# Patient Record
Sex: Male | Born: 1945
Health system: Southern US, Community
[De-identification: ages and names within clinical notes are randomized; demographics above are authoritative.]

## PROBLEM LIST (undated history)

## (undated) DIAGNOSIS — I10 Essential (primary) hypertension: Secondary | ICD-10-CM

## (undated) DIAGNOSIS — R748 Abnormal levels of other serum enzymes: Secondary | ICD-10-CM

## (undated) DIAGNOSIS — I4891 Unspecified atrial fibrillation: Secondary | ICD-10-CM

## (undated) DIAGNOSIS — R39198 Other difficulties with micturition: Secondary | ICD-10-CM

## (undated) DIAGNOSIS — G4730 Sleep apnea, unspecified: Secondary | ICD-10-CM

## (undated) DIAGNOSIS — I499 Cardiac arrhythmia, unspecified: Secondary | ICD-10-CM

## (undated) DIAGNOSIS — N400 Enlarged prostate without lower urinary tract symptoms: Secondary | ICD-10-CM

## (undated) DIAGNOSIS — R972 Elevated prostate specific antigen [PSA]: Secondary | ICD-10-CM

## (undated) DIAGNOSIS — E7800 Pure hypercholesterolemia, unspecified: Secondary | ICD-10-CM

## (undated) HISTORY — DX: Essential (primary) hypertension: I10

## (undated) HISTORY — PX: TONSILLECTOMY: SUR1361

## (undated) HISTORY — PX: PROSTATE BIOPSY: SHX241

## (undated) HISTORY — DX: Abnormal levels of other serum enzymes: R74.8

## (undated) HISTORY — DX: Elevated prostate specific antigen (PSA): R97.20

## (undated) HISTORY — DX: Sleep apnea, unspecified: G47.30

## (undated) HISTORY — DX: Benign prostatic hyperplasia without lower urinary tract symptoms: N40.0

## (undated) HISTORY — DX: Pure hypercholesterolemia, unspecified: E78.00

## (undated) HISTORY — DX: Unspecified atrial fibrillation: I48.91

## (undated) HISTORY — DX: Other difficulties with micturition: R39.198

---

## 2003-03-13 DIAGNOSIS — R972 Elevated prostate specific antigen [PSA]: Secondary | ICD-10-CM | POA: Insufficient documentation

## 2006-03-07 DIAGNOSIS — Z86010 Personal history of colonic polyps: Secondary | ICD-10-CM | POA: Insufficient documentation

## 2006-03-07 DIAGNOSIS — D126 Benign neoplasm of colon, unspecified: Secondary | ICD-10-CM | POA: Insufficient documentation

## 2008-01-03 HISTORY — PX: HERNIA REPAIR: SHX51

## 2012-01-30 DIAGNOSIS — J385 Laryngeal spasm: Secondary | ICD-10-CM | POA: Insufficient documentation

## 2012-05-02 DIAGNOSIS — I482 Chronic atrial fibrillation, unspecified: Secondary | ICD-10-CM | POA: Insufficient documentation

## 2014-01-02 DIAGNOSIS — Z9889 Other specified postprocedural states: Secondary | ICD-10-CM

## 2014-01-02 HISTORY — DX: Other specified postprocedural states: Z98.890

## 2015-04-20 DIAGNOSIS — N401 Enlarged prostate with lower urinary tract symptoms: Secondary | ICD-10-CM | POA: Insufficient documentation

## 2016-07-03 HISTORY — PX: COLONOSCOPY: SHX174

## 2017-05-29 DIAGNOSIS — G4733 Obstructive sleep apnea (adult) (pediatric): Secondary | ICD-10-CM | POA: Insufficient documentation

## 2017-06-04 LAB — BASIC METABOLIC PANEL
BUN: 27 — AB (ref 4–21)
CO2: 31 — AB (ref 13–22)
Chloride: 101 (ref 99–108)
Creatinine: 1 (ref 0.6–1.3)
Glucose: 72
Potassium: 4.3 (ref 3.4–5.3)
Sodium: 141 (ref 137–147)

## 2017-06-04 LAB — CBC AND DIFFERENTIAL
HCT: 44 (ref 41–53)
Hemoglobin: 14.1 (ref 13.5–17.5)
Platelets: 219 (ref 150–399)
WBC: 8.8

## 2017-06-04 LAB — CBC: RBC: 4.29 (ref 3.87–5.11)

## 2017-06-04 LAB — COMPREHENSIVE METABOLIC PANEL: Calcium: 9.7 (ref 8.7–10.7)

## 2017-06-14 LAB — CBC AND DIFFERENTIAL
Hemoglobin: 14.3 (ref 13.5–17.5)
Neutrophils Absolute: 5
Platelets: 232 (ref 150–399)
WBC: 7.2

## 2017-06-14 LAB — VITAMIN B12: Vitamin B-12: 793

## 2017-06-14 LAB — CBC: RBC: 4.34 (ref 3.87–5.11)

## 2017-11-23 DIAGNOSIS — R030 Elevated blood-pressure reading, without diagnosis of hypertension: Secondary | ICD-10-CM | POA: Insufficient documentation

## 2018-06-04 DIAGNOSIS — M545 Low back pain, unspecified: Secondary | ICD-10-CM | POA: Insufficient documentation

## 2019-06-05 LAB — BASIC METABOLIC PANEL
BUN: 16 (ref 4–21)
CO2: 29 — AB (ref 13–22)
Chloride: 101 (ref 99–108)
Creatinine: 0.8 (ref 0.6–1.3)
Glucose: 80
Potassium: 4.4 (ref 3.4–5.3)
Sodium: 139 (ref 137–147)

## 2019-06-05 LAB — CBC: RBC: 4.22 (ref 3.87–5.11)

## 2019-06-05 LAB — CBC AND DIFFERENTIAL
HCT: 45 (ref 41–53)
Hemoglobin: 14.5 (ref 13.5–17.5)
Platelets: 177 (ref 150–399)
WBC: 9.2

## 2019-06-05 LAB — COMPREHENSIVE METABOLIC PANEL
Calcium: 9.7 (ref 8.7–10.7)
GFR calc non Af Amer: 87

## 2019-06-20 DIAGNOSIS — K4091 Unilateral inguinal hernia, without obstruction or gangrene, recurrent: Secondary | ICD-10-CM | POA: Insufficient documentation

## 2019-06-20 LAB — BASIC METABOLIC PANEL
BUN: 19 (ref 4–21)
CO2: 27 — AB (ref 13–22)
Chloride: 104 (ref 99–108)
Creatinine: 0.8 (ref 0.6–1.3)
Glucose: 82
Potassium: 4.6 (ref 3.4–5.3)
Sodium: 139 (ref 137–147)

## 2019-06-20 LAB — COMPREHENSIVE METABOLIC PANEL
Calcium: 9.8 (ref 8.7–10.7)
GFR calc non Af Amer: 88

## 2019-06-21 LAB — CBC: RBC: 4.33 (ref 3.87–5.11)

## 2019-06-21 LAB — CBC AND DIFFERENTIAL
HCT: 45 (ref 41–53)
Hemoglobin: 15.2 (ref 13.5–17.5)
Platelets: 204 (ref 150–399)
WBC: 7.1

## 2019-07-24 ENCOUNTER — Other Ambulatory Visit: Payer: Self-pay

## 2019-07-24 ENCOUNTER — Encounter: Payer: Self-pay | Admitting: Nurse Practitioner

## 2019-07-24 ENCOUNTER — Ambulatory Visit: Payer: Medicare PPO | Admitting: Nurse Practitioner

## 2019-07-24 VITALS — BP 110/80 | HR 67 | Temp 97.8°F | Ht 68.0 in | Wt 184.0 lb

## 2019-07-24 DIAGNOSIS — M47812 Spondylosis without myelopathy or radiculopathy, cervical region: Secondary | ICD-10-CM

## 2019-07-24 DIAGNOSIS — K4090 Unilateral inguinal hernia, without obstruction or gangrene, not specified as recurrent: Secondary | ICD-10-CM

## 2019-07-24 DIAGNOSIS — E782 Mixed hyperlipidemia: Secondary | ICD-10-CM

## 2019-07-24 DIAGNOSIS — R350 Frequency of micturition: Secondary | ICD-10-CM

## 2019-07-24 DIAGNOSIS — N401 Enlarged prostate with lower urinary tract symptoms: Secondary | ICD-10-CM

## 2019-07-24 DIAGNOSIS — I480 Paroxysmal atrial fibrillation: Secondary | ICD-10-CM | POA: Diagnosis not present

## 2019-07-24 DIAGNOSIS — H9113 Presbycusis, bilateral: Secondary | ICD-10-CM | POA: Diagnosis not present

## 2019-07-24 DIAGNOSIS — Z7189 Other specified counseling: Secondary | ICD-10-CM | POA: Diagnosis not present

## 2019-07-24 DIAGNOSIS — I10 Essential (primary) hypertension: Secondary | ICD-10-CM

## 2019-07-24 DIAGNOSIS — G4733 Obstructive sleep apnea (adult) (pediatric): Secondary | ICD-10-CM

## 2019-07-24 DIAGNOSIS — K5904 Chronic idiopathic constipation: Secondary | ICD-10-CM

## 2019-07-24 DIAGNOSIS — Z9989 Dependence on other enabling machines and devices: Secondary | ICD-10-CM

## 2019-07-24 NOTE — Progress Notes (Signed)
Careteam: Patient Care Team: Lauree Chandler, NP as PCP - General (Geriatric Medicine)  Advanced Directive information    No Known Allergies  Chief Complaint  Patient presents with   Establish Care    New patient to establish care.     HPI: Patient is a 74 y.o. male seen in today at the [facility] to establish care. Previously with [facility] and has been at [facility] for 2 years.   Last AWV 06/20/2019  Reports he has a hernia and taking OTC stool softener for constipation and metamucil.   OA of the neck- will occasional get a stiff neck and uses skelaxin 800 mg every 8 hours as needed if it happens. Will use heat. Has not needed this year  A fib- rate controlled, on Toprol XL and xarelto 20 mg daily for anticoagulation. Does not see cardiology  BPH with frequency- on tamsulosin 0.4 mg daily, gets up 2-3 times at night. A little worse recently   Sleep apnea- has CPAP, gets supplies through [facility]    Review of Systems:  Review of Systems  Constitutional: Negative for chills, fever and weight loss.  HENT: Positive for hearing loss (hearing loss). Negative for tinnitus.   Respiratory: Negative for cough, sputum production and shortness of breath.   Cardiovascular: Negative for chest pain, palpitations and leg swelling.  Gastrointestinal: Negative for abdominal pain, constipation, diarrhea and heartburn.  Genitourinary: Negative for dysuria, frequency and urgency.  Musculoskeletal: Negative for back pain, falls, joint pain and myalgias.  Skin: Negative.   Neurological: Negative for dizziness and headaches.  Psychiatric/Behavioral: Negative for depression and memory loss. The patient does not have insomnia.    Past Medical History:  Diagnosis Date   Atrial fibrillation [facility])    Per [place] new patient packet   Hx of colonoscopy 2016   Per [place] new patient packet   Sleep apnea    Per [place] new patient packet   Past Surgical History:  Procedure Laterality Date    HERNIA REPAIR  2010   Per [place] new patient packet   Social History:   reports that he has never smoked. He has never used smokeless tobacco. He reports current alcohol use of about 14.0 standard drinks of alcohol per week. He reports that he does not use drugs.  Family History  Problem Relation Age of Onset   Aneurysm Father    Heart attack Father     Medications: Patient's Medications  New Prescriptions   No medications on file  Previous Medications   DOCUSATE CALCIUM (STOOL SOFTENER PO)    Take by mouth daily. As needed   METAXALONE (SKELAXIN) 800 MG TABLET    Take 800 mg by mouth 3 (three) times daily as needed. For pain   METOPROLOL SUCCINATE (TOPROL-XL) 25 MG 24 HR TABLET    Take 25 mg by mouth daily.   PRAVASTATIN (PRAVACHOL) 20 MG TABLET    Take 20 mg by mouth daily.   PSYLLIUM FIBER PO    Take by mouth daily. As needed   RIVAROXABAN (XARELTO) 20 MG TABS TABLET    Take 20 mg by mouth daily.   TAMSULOSIN (FLOMAX) 0.4 MG CAPS CAPSULE    Take 4 mg by mouth daily.  Modified Medications   No medications on file  Discontinued Medications   No medications on file    Physical Exam:  Vitals:   07/24/19 1002  BP: 110/80  Pulse: 67  Temp: 97.8 F (36.6 C)  TempSrc:  Temporal  SpO2: 99%  Weight: 184 lb (83.5 kg)  Height: 5\' 8"  (1.727 m)   Body mass index is 27.98 kg/m. Wt Readings from Last 3 Encounters:  07/24/19 184 lb (83.5 kg)    Physical Exam Constitutional:      General: He is not in acute distress.    Appearance: He is well-developed. He is not diaphoretic.  HENT:     Head: Normocephalic and atraumatic.     Mouth/Throat:     Pharynx: No oropharyngeal exudate.  Eyes:     Conjunctiva/sclera: Conjunctivae normal.     Pupils: Pupils are equal, round, and reactive to light.  Cardiovascular:     Rate and Rhythm: Normal rate and regular rhythm.     Heart sounds: Normal heart sounds.  Pulmonary:     Effort: Pulmonary effort is normal.     Breath sounds:  Normal breath sounds.  Abdominal:     General: Bowel sounds are normal.     Palpations: Abdomen is soft.     Hernia: A hernia is present. Right inguinal: easily reducible, nontender.  Musculoskeletal:        General: No tenderness.     Cervical back: Normal range of motion and neck supple.  Skin:    General: Skin is warm and dry.  Neurological:     Mental Status: He is alert and oriented to person, place, and time.  Psychiatric:        Mood and Affect: Mood normal.        Behavior: Behavior normal.     Labs reviewed: Basic Metabolic Panel: No results for input(s): NA, K, CL, CO2, GLUCOSE, BUN, CREATININE, CALCIUM, MG, PHOS, TSH in the last 8760 hours. Liver Function Tests: No results for input(s): AST, ALT, ALKPHOS, BILITOT, PROT, ALBUMIN in the last 8760 hours. No results for input(s): LIPASE, AMYLASE in the last 8760 hours. No results for input(s): AMMONIA in the last 8760 hours. CBC: No results for input(s): WBC, NEUTROABS, HGB, HCT, MCV, PLT in the last 8760 hours. Lipid Panel: No results for input(s): CHOL, HDL, LDLCALC, TRIG, CHOLHDL, LDLDIRECT in the last 8760 hours. TSH: No results for input(s): TSH in the last 8760 hours. A1C: No results found for: HGBA1C   Assessment/Plan 1. Advance care planning -to bring in documents and discussed completing MOST form - DNR (Do Not Resuscitate)  2. Benign prostatic hyperplasia with urinary frequency -ongoing, currently taking flomax 0.4 mg daily, gets up 2-3 times at night, hx of elevated PSA, will follow up PSA with next labs.   3. Paroxysmal atrial fibrillation (HCC) Rate controlled. Continues on metoprolol twice daily for rate and xarelto for anticoagulation. No signs of bleeding or abnormal bruising noted. Will follow up CBC prior to next appt.   4. Presbycusis of both ears -stable with bilateral hearing aids.   5. Inguinal hernia of right side without obstruction or gangrene Has improved with control of constipation.  Easily reducible and nontender. Education provided on when to seek emergent medical attention.   6. Chronic idiopathic constipation Stable on colace and metamucil   7. Spondylosis of cervical region without myelopathy or radiculopathy Stable at this time. Occasionally will get increase in pain and uses skelaxin PRN, rarely needs  8. OSA on CPAP Stable, uses CPAP every night.  9. Essential hypertension Controlled on metoprolol and dietary modifications.  10. Mixed hyperlipidemia Will follow up lipids prior to next appt. Continues on pravastatin and dietary modifications.   Next appt: 5 months with labs  prior  Next AWV due in June 2022 Meeyah Ovitt K. [place], [facility] [phone]

## 2019-11-18 ENCOUNTER — Encounter: Payer: Self-pay | Admitting: Nurse Practitioner

## 2019-11-20 NOTE — Telephone Encounter (Signed)
Message routed to Lauree Chandler, NP . Please Advise.

## 2019-11-25 ENCOUNTER — Encounter: Payer: Self-pay | Admitting: Nurse Practitioner

## 2019-12-01 ENCOUNTER — Encounter: Payer: Self-pay | Admitting: Nurse Practitioner

## 2019-12-01 NOTE — Telephone Encounter (Signed)
Message routed to Eubanks, Jessica K, NP  

## 2019-12-16 ENCOUNTER — Other Ambulatory Visit: Payer: Self-pay

## 2019-12-18 LAB — BASIC METABOLIC PANEL
BUN: 22 — AB (ref 4–21)
CO2: 28 — AB (ref 13–22)
Chloride: 102 (ref 99–108)
Creatinine: 0.9 (ref 0.6–1.3)
Glucose: 83
Potassium: 4.4 (ref 3.4–5.3)
Sodium: 139 (ref 137–147)

## 2019-12-18 LAB — CBC AND DIFFERENTIAL
HCT: 42 (ref 41–53)
Hemoglobin: 14.5 (ref 13.5–17.5)
Platelets: 195 (ref 150–399)
WBC: 7.2

## 2019-12-18 LAB — CBC: RBC: 4.17 (ref 3.87–5.11)

## 2019-12-18 LAB — HEPATIC FUNCTION PANEL
ALT: 29 (ref 10–40)
AST: 27 (ref 14–40)
Alkaline Phosphatase: 58 (ref 25–125)
Bilirubin, Total: 1.7

## 2019-12-18 LAB — COMPREHENSIVE METABOLIC PANEL
Albumin: 4.3 (ref 3.5–5.0)
Calcium: 9.5 (ref 8.7–10.7)

## 2019-12-18 LAB — PSA: PSA: 7.23

## 2019-12-19 ENCOUNTER — Encounter: Payer: Self-pay | Admitting: *Deleted

## 2019-12-23 ENCOUNTER — Other Ambulatory Visit: Payer: Self-pay

## 2019-12-23 ENCOUNTER — Encounter: Payer: Self-pay | Admitting: Nurse Practitioner

## 2019-12-23 ENCOUNTER — Ambulatory Visit: Payer: Medicare PPO | Admitting: Nurse Practitioner

## 2019-12-23 VITALS — BP 130/80 | HR 54 | Temp 97.5°F | Ht 68.0 in | Wt 188.5 lb

## 2019-12-23 DIAGNOSIS — N401 Enlarged prostate with lower urinary tract symptoms: Secondary | ICD-10-CM

## 2019-12-23 DIAGNOSIS — I10 Essential (primary) hypertension: Secondary | ICD-10-CM

## 2019-12-23 DIAGNOSIS — R350 Frequency of micturition: Secondary | ICD-10-CM

## 2019-12-23 DIAGNOSIS — Z86010 Personal history of colonic polyps: Secondary | ICD-10-CM

## 2019-12-23 DIAGNOSIS — H9113 Presbycusis, bilateral: Secondary | ICD-10-CM

## 2019-12-23 DIAGNOSIS — R972 Elevated prostate specific antigen [PSA]: Secondary | ICD-10-CM

## 2019-12-23 DIAGNOSIS — E782 Mixed hyperlipidemia: Secondary | ICD-10-CM

## 2019-12-23 DIAGNOSIS — Z9989 Dependence on other enabling machines and devices: Secondary | ICD-10-CM

## 2019-12-23 DIAGNOSIS — G4733 Obstructive sleep apnea (adult) (pediatric): Secondary | ICD-10-CM

## 2019-12-23 DIAGNOSIS — I480 Paroxysmal atrial fibrillation: Secondary | ICD-10-CM

## 2019-12-23 NOTE — Progress Notes (Signed)
Careteam: Patient Care Team: Lauree Chandler, NP as PCP - General (Geriatric Medicine)  Advanced Directive information Does Patient Have a Medical Advance Directive?: Yes, Type of Advance Directive: Out of facility DNR (pink MOST or yellow form), Pre-existing out of facility DNR order (yellow form or pink MOST form): Yellow form placed in chart (order not valid for inpatient use), Does patient want to make changes to medical advance directive?: No - Patient declined  No Known Allergies  Chief Complaint  Patient presents with   Medical Management of Chronic Issues    5 month follow up.     HPI: Patient is a 74 y.o. male seen in today at the [facility] for routine follow up.  OSA- continues on CPAP    A fib- ongoing occasional palpitations, overall stable. No chest pains. No LE edema.   Last colonoscopy 07/2016. 10 mm adenomatous polyp found. Recommended repeat in 3 years (2021- did not get done)   Pt with hx of elevated PSA- went to urologist for years then he was told he did not need to be seen anymore.  Reports he did have biopsy of prostate ~10 years ago and it was negative. Continues with urinary frequency. Will have multiple trips to the bathroom. Takes a while to urinate.  His urologist tried for many years to get him to do something for his prostate but he declined.  Review of Systems:  Review of Systems  Constitutional: Negative for chills, fever and weight loss.  HENT: Negative for tinnitus.   Respiratory: Negative for cough, sputum production and shortness of breath.   Cardiovascular: Negative for chest pain, palpitations and leg swelling.  Gastrointestinal: Positive for constipation (controlled). Negative for abdominal pain, diarrhea and heartburn.  Genitourinary: Positive for frequency. Negative for dysuria and urgency.  Musculoskeletal: Negative for back pain, falls, joint pain and myalgias.  Skin: Negative.   Neurological: Negative for dizziness  and headaches.  Psychiatric/Behavioral: Negative for depression and memory loss. The patient does not have insomnia.     Past Medical History:  Diagnosis Date   Atrial fibrillation [facility])    Per [place] new patient packet   BPH (benign prostatic hyperplasia)    Elevated PSA    Enlarged prostate    High cholesterol    Hx of colonoscopy 2016   Per [place] new patient packet   Hypertension    Sleep apnea    Per [place] new patient packet   Slow urinary stream    Past Surgical History:  Procedure Laterality Date   COLONOSCOPY  07/03/2016   HERNIA REPAIR Right 2010   inguinal hernia    PROSTATE BIOPSY     TONSILLECTOMY     Social History:   reports that he has never smoked. He has never used smokeless tobacco. He reports current alcohol use of about 14.0 standard drinks of alcohol per week. He reports that he does not use drugs.  Family History  Problem Relation Age of Onset   Aneurysm Father    Heart attack Father    Heart disease Father     Medications: Patient's Medications  New Prescriptions   No medications on file  Previous Medications   DOCUSATE CALCIUM (STOOL SOFTENER PO)    Take by mouth daily. As needed   METAXALONE (SKELAXIN) 800 MG TABLET    Take 800 mg by mouth 3 (three) times daily as needed. For pain   METOPROLOL SUCCINATE (TOPROL-XL) 25 MG 24 HR TABLET    Take  25 mg by mouth daily.   PRAVASTATIN (PRAVACHOL) 20 MG TABLET    Take 20 mg by mouth daily.   PSYLLIUM FIBER PO    Take by mouth daily. As needed   RIVAROXABAN (XARELTO) 20 MG TABS TABLET    Take 20 mg by mouth daily.   TAMSULOSIN (FLOMAX) 0.4 MG CAPS CAPSULE    Take 4 mg by mouth daily.  Modified Medications   No medications on file  Discontinued Medications   No medications on file    Physical Exam:  Vitals:   12/23/19 0923  BP: 130/80  Pulse: (!) 54  Temp: (!) 97.5 F (36.4 C)  TempSrc: Temporal  SpO2: 99%  Weight: 188 lb 8 oz (85.5 kg)  Height: 5\' 8"  (1.727 m)   Body mass  index is 28.66 kg/m. Wt Readings from Last 3 Encounters:  12/23/19 188 lb 8 oz (85.5 kg)  07/24/19 184 lb (83.5 kg)    Physical Exam Constitutional:      General: He is not in acute distress.    Appearance: He is well-developed and well-nourished. He is not diaphoretic.  HENT:     Head: Normocephalic and atraumatic.     Mouth/Throat:     Mouth: Oropharynx is clear and moist.     Pharynx: No oropharyngeal exudate.  Eyes:     Extraocular Movements: EOM normal.     Conjunctiva/sclera: Conjunctivae normal.     Pupils: Pupils are equal, round, and reactive to light.  Cardiovascular:     Rate and Rhythm: Normal rate and regular rhythm.     Heart sounds: Normal heart sounds.  Pulmonary:     Effort: Pulmonary effort is normal.     Breath sounds: Normal breath sounds.  Abdominal:     General: Bowel sounds are normal.     Palpations: Abdomen is soft.  Musculoskeletal:        General: No tenderness or edema.     Cervical back: Normal range of motion and neck supple.     Right lower leg: No edema.     Left lower leg: No edema.  Skin:    General: Skin is warm and dry.  Neurological:     Mental Status: He is alert and oriented to person, place, and time.  Psychiatric:        Mood and Affect: Mood and affect normal.     Labs reviewed: Basic Metabolic Panel: Recent Labs    06/05/19 0000 06/20/19 0000 12/18/19 0000  NA 139 139 139  K 4.4 4.6 4.4  CL 101 104 102  CO2 29* 27* 28*  BUN 16 19 22*  CREATININE 0.8 0.8 0.9  CALCIUM 9.7 9.8 9.5   Liver Function Tests: Recent Labs    12/18/19 0000  AST 27  ALT 29  ALKPHOS 58  ALBUMIN 4.3   No results for input(s): LIPASE, AMYLASE in the last 8760 hours. No results for input(s): AMMONIA in the last 8760 hours. CBC: Recent Labs    06/05/19 0000 06/21/19 0000 12/18/19 0000  WBC 9.2 7.1 7.2  HGB 14.5 15.2 14.5  HCT 45 45 42  PLT 177 204 195   Lipid Panel: No results for input(s): CHOL, HDL, LDLCALC, TRIG, CHOLHDL,  LDLDIRECT in the last 8760 hours. TSH: No results for input(s): TSH in the last 8760 hours. A1C: No results found for: HGBA1C   Assessment/Plan 1. Benign prostatic hyperplasia with urinary frequency -ongoing and stable, continues on flomax  2. Elevated PSA -slow rise  in PSA, now at 7.23 up from 6 3 years ago. Previously followed by urology but he was not interested in any intervention at that time. Biopsy was negative in the past.   3. History of adenomatous polyp of colon GI recommended follow up in 2021, will get referral at this time.  - Ambulatory referral to Gastroenterology  4. Paroxysmal atrial fibrillation (HCC) Rate controlled. contines on xarelto for anticoaguation and metoprolol for rate control. No signs of bleeding or abnormal bruising at this time.   5. OSA on CPAP -stable on cpap  6. Essential hypertension Well controlled on toprol-XL  7. Mixed hyperlipidemia -continues on pravastatin with dietary modifications.   8. Presbycusis of both ears -continues with hearing aids Next appt: 6 months, labs prior to appt.   Carlos American. [place], [facility] [phone]

## 2019-12-23 NOTE — Patient Instructions (Signed)
Let us know if you decide you want urology referral in regards to prostate.  Follow up in 6 months, labs before appt.

## 2020-04-29 ENCOUNTER — Other Ambulatory Visit: Payer: Self-pay

## 2020-04-29 ENCOUNTER — Ambulatory Visit: Payer: Medicare PPO | Admitting: Nurse Practitioner

## 2020-04-29 VITALS — HR 71

## 2020-04-29 DIAGNOSIS — R310 Gross hematuria: Secondary | ICD-10-CM

## 2020-04-29 DIAGNOSIS — R350 Frequency of micturition: Secondary | ICD-10-CM | POA: Diagnosis not present

## 2020-04-29 DIAGNOSIS — N401 Enlarged prostate with lower urinary tract symptoms: Secondary | ICD-10-CM

## 2020-04-29 NOTE — Progress Notes (Signed)
Careteam: Patient Care Team: Lauree Chandler, NP as PCP - General (Geriatric Medicine)  Advanced Directive information    No Known Allergies  No chief complaint on file.    HPI: Patient is a 75 y.o. male seen in today at the [facility] for blood in urine.  He noticed blood in the urine 4 nights ago. Started pink got darker then resolved by morning.Also had some leakage.    He has had this happen before June 2017 and December 2018 at the time he had a cscope which was normal- no findings. No episodes since then.  Would like referral to urologist due to this  Also having frequency . Taking flomax   Reports he was fount to have UTI.   Review of Systems:  Review of Systems  Constitutional: Negative for chills and fever.  HENT: Negative for tinnitus.   Cardiovascular: Negative for chest pain and palpitations.  Gastrointestinal: Negative for abdominal pain, constipation, diarrhea and heartburn.  Genitourinary: Positive for frequency, hematuria and urgency. Negative for dysuria.  Musculoskeletal: Negative for back pain, falls, joint pain and myalgias.  Skin: Negative.   Neurological: Negative for dizziness and headaches.    Past Medical History:  Diagnosis Date  . Atrial fibrillation ([place])    Per [facility] new patient packet  . BPH (benign prostatic hyperplasia)   . Elevated PSA   . Enlarged prostate   . High cholesterol   . Hx of colonoscopy 2016   Per [place] new patient packet  . Hypertension   . Sleep apnea    Per [place] new patient packet  . Slow urinary stream    Past Surgical History:  Procedure Laterality Date  . COLONOSCOPY  07/03/2016  . HERNIA REPAIR Right 2010   inguinal hernia   . PROSTATE BIOPSY    . TONSILLECTOMY     Social History:   reports that he has never smoked. He has never used smokeless tobacco. He reports current alcohol use of about 14.0 standard drinks of alcohol per week. He reports that he does not use drugs.  Family History   Problem Relation Age of Onset  . Aneurysm Father   . Heart attack Father   . Heart disease Father     Medications: Patient's Medications  New Prescriptions   No medications on file  Previous Medications   DOCUSATE CALCIUM (STOOL SOFTENER PO)    Take by mouth daily. As needed   METAXALONE (SKELAXIN) 800 MG TABLET    Take 800 mg by mouth 3 (three) times daily as needed. For pain   METOPROLOL SUCCINATE (TOPROL-XL) 25 MG 24 HR TABLET    Take 25 mg by mouth daily.   PRAVASTATIN (PRAVACHOL) 20 MG TABLET    Take 20 mg by mouth daily.   PSYLLIUM FIBER PO    Take by mouth daily. As needed   RIVAROXABAN (XARELTO) 20 MG TABS TABLET    Take 20 mg by mouth daily.   TAMSULOSIN (FLOMAX) 0.4 MG CAPS CAPSULE    Take 4 mg by mouth daily.  Modified Medications   No medications on file  Discontinued Medications   No medications on file    Physical Exam:  There were no vitals filed for this visit. There is no height or weight on file to calculate BMI. Wt Readings from Last 3 Encounters:  12/23/19 188 lb 8 oz (85.5 kg)  07/24/19 184 lb (83.5 kg)    Physical Exam Constitutional:      General:  He is not in acute distress.    Appearance: He is well-developed. He is not diaphoretic.  HENT:     Head: Normocephalic and atraumatic.     Mouth/Throat:     Pharynx: No oropharyngeal exudate.  Eyes:     Conjunctiva/sclera: Conjunctivae normal.     Pupils: Pupils are equal, round, and reactive to light.  Cardiovascular:     Rate and Rhythm: Normal rate and regular rhythm.     Heart sounds: Normal heart sounds.  Pulmonary:     Effort: Pulmonary effort is normal.     Breath sounds: Normal breath sounds.  Abdominal:     General: Bowel sounds are normal.     Palpations: Abdomen is soft.  Musculoskeletal:        General: No tenderness.     Cervical back: Normal range of motion and neck supple.  Skin:    General: Skin is warm and dry.  Neurological:     Mental Status: He is alert and oriented to  person, place, and time.    Labs reviewed: Basic Metabolic Panel: Recent Labs    06/05/19 0000 06/20/19 0000 12/18/19 0000  NA 139 139 139  K 4.4 4.6 4.4  CL 101 104 102  CO2 29* 27* 28*  BUN 16 19 22*  CREATININE 0.8 0.8 0.9  CALCIUM 9.7 9.8 9.5   Liver Function Tests: Recent Labs    12/18/19 0000  AST 27  ALT 29  ALKPHOS 58  ALBUMIN 4.3   No results for input(s): LIPASE, AMYLASE in the last 8760 hours. No results for input(s): AMMONIA in the last 8760 hours. CBC: Recent Labs    06/05/19 0000 06/21/19 0000 12/18/19 0000  WBC 9.2 7.1 7.2  HGB 14.5 15.2 14.5  HCT 45 45 42  PLT 177 204 195   Lipid Panel: No results for input(s): CHOL, HDL, LDLCALC, TRIG, CHOLHDL, LDLDIRECT in the last 8760 hours. TSH: No results for input(s): TSH in the last 8760 hours. A1C: No results found for: HGBA1C   Assessment/Plan 1. Gross hematuria Resolved at this time but continues to have frequency and urgency with some leakage. Will send urine off for UA C&S -to remain hydrated -no fevers or chills at this time but if occurs to seek medical attention immediately - Ambulatory referral to Urology  2. Benign prostatic hyperplasia with urinary frequency Increase frequency, UA C&S  - Ambulatory referral to Urology  Next appt: 06/22/2020 as scheduled, sooner if needed Randy [place], [facility] [phone]

## 2020-04-30 ENCOUNTER — Telehealth: Payer: Self-pay

## 2020-04-30 NOTE — Telephone Encounter (Addendum)
Discussed results with patient and he verbalized his understanding. He has received call from urology and scheduled appointment with them for May 9th.    ----- Message from Lauree Chandler, NP sent at 04/30/2020  2:45 PM EDT ----- NO urinary tract infection noted on labs, urine was normal, urology referral has been placed  ----- Message ----- From: May, Anita A, CMA Sent: 04/30/2020   2:26 PM EDT To: Lauree Chandler, NP

## 2020-05-10 ENCOUNTER — Encounter: Payer: Self-pay | Admitting: Urology

## 2020-05-10 ENCOUNTER — Ambulatory Visit: Payer: Medicare PPO | Admitting: Urology

## 2020-05-10 ENCOUNTER — Other Ambulatory Visit: Payer: Self-pay

## 2020-05-10 VITALS — BP 163/82 | HR 67 | Ht 67.0 in | Wt 186.0 lb

## 2020-05-10 DIAGNOSIS — N390 Urinary tract infection, site not specified: Secondary | ICD-10-CM | POA: Diagnosis not present

## 2020-05-10 DIAGNOSIS — N400 Enlarged prostate without lower urinary tract symptoms: Secondary | ICD-10-CM | POA: Diagnosis not present

## 2020-05-10 DIAGNOSIS — R319 Hematuria, unspecified: Secondary | ICD-10-CM | POA: Diagnosis not present

## 2020-05-10 LAB — BLADDER SCAN AMB NON-IMAGING

## 2020-05-10 NOTE — Progress Notes (Signed)
05/10/20 1:29 PM   Simonne Martinet [birth year] [id number]  CC: Gross hematuria, elevated PSA, BPH  HPI: I saw Mr. Prevette for the above issues.  He is a relatively healthy 75 year old male who has a long history of BPH and urinary symptoms on Flomax.  His primary urinary symptoms are weak stream, feeling of incomplete emptying, urinary frequency, nocturia every 2 hours overnight.  He is also had multiple episodes of gross hematuria in the past that have been evaluated at [facility] with CT urogram and cystoscopy.  He reports some light pink urine over the last 1 to 2 weeks that is new.  He denies any blood clots in the urine.  Most recent imaging was a CT urogram in January 2019 that showed no hydronephrosis or nephrolithiasis, thickened bladder with multiple diverticula, and prostate measured 97 g.  Cystoscopy at that time showed BPH with obstructive lateral lobes, but no suspicious bladder lesions.  Renal function is normal with creatinine 0.82.  Urinalysis today pending.   PMH: Past Medical History:  Diagnosis Date  . Atrial fibrillation ([place])    Per [facility] new patient packet  . BPH (benign prostatic hyperplasia)   . Elevated PSA   . Enlarged prostate   . High cholesterol   . Hx of colonoscopy 2016   Per [place] new patient packet  . Hypertension   . Sleep apnea    Per [place] new patient packet  . Slow urinary stream     Surgical History: Past Surgical History:  Procedure Laterality Date  . COLONOSCOPY  07/03/2016  . HERNIA REPAIR Right 2010   inguinal hernia   . PROSTATE BIOPSY    . TONSILLECTOMY      Family History: Family History  Problem Relation Age of Onset  . Aneurysm Father   . Heart attack Father   . Heart disease Father     Social History:  reports that he has never smoked. He has never used smokeless tobacco. He reports current alcohol use of about 14.0 standard drinks of alcohol per week. He reports that he does not use drugs.  Physical Exam: BP (!) 163/82 (BP  Location: Left Arm, Patient Position: Sitting, Cuff Size: Normal)   Pulse 67   Ht 5\' 7"  (1.702 m)   Wt 186 lb (84.4 kg)   BMI 29.13 kg/m    Constitutional:  Alert and oriented, No acute distress. Cardiovascular: No clubbing, cyanosis, or edema. Respiratory: Normal respiratory effort, no increased work of breathing. GI: Abdomen is soft, nontender, nondistended, no abdominal masses  Laboratory Data: Reviewed, see HPI  Pertinent Imaging: I have personally viewed and interpreted the CT urogram from care everywhere dated 01/14/2017 showing no hydronephrosis or nephrolithiasis, and prostate measuring 97 g and multiple bladder diverticula  Assessment & Plan:   75 year old male with atrial fibrillation on Eliquis who presents with recurrent gross hematuria.  This has been going on at least 5 to 6 years now intermittently, and is likely secondary to BPH with enlarged prostate in the setting of anticoagulation.  He has had 2 negative gross hematuria work-up with [facility], most recently in 2019, and prostate measures 97 g.  He is also had elevated PSA of 7 with a history of negative biopsy, and that has been stable over the last 5+ years, likely elevated secondary to enlarged prostate BPH.  We discussed the risks and benefits of HoLEP at length.  The procedure requires general anesthesia and takes 2 to 3 hours, and a holmium laser is used to  enucleate the prostate and push this tissue into the bladder.  A morcellator is then used to remove this tissue, which is sent for pathology.  The vast majority of patients are able to discharge the same day with a catheter in place for 2 to 3 days, and will follow-up in clinic for a voiding trial.  Approximately 5% of patients will be admitted overnight to monitor the urine, or if they have multiple co-morbidities.  We specifically discussed the risks of bleeding, infection, retrograde ejaculation, temporary urgency and urge incontinence, very low risk of long-term  incontinence, pathologic evaluation of prostate tissue and possible detection of prostate cancer or other malignancy, and possible need for additional procedures.  -Follow-up UA results, call if UTI -He will consider HOLEP, I do think he would benefit significantly from this with his history of recurrent gross hematuria, UTI, and large prostate with bladder diverticula  Nickolas Madrid, MD 05/10/2020  [facility] [address] [place], [place] [address] [phone]

## 2020-05-10 NOTE — Patient Instructions (Signed)

## 2020-05-11 LAB — MICROSCOPIC EXAMINATION
Bacteria, UA: NONE SEEN
Epithelial Cells (non renal): NONE SEEN /hpf (ref 0–10)

## 2020-05-11 LAB — URINALYSIS, COMPLETE
Bilirubin, UA: NEGATIVE
Glucose, UA: NEGATIVE
Ketones, UA: NEGATIVE
Leukocytes,UA: NEGATIVE
Nitrite, UA: NEGATIVE
Protein,UA: NEGATIVE
RBC, UA: NEGATIVE
Specific Gravity, UA: 1.015 (ref 1.005–1.030)
Urobilinogen, Ur: 0.2 mg/dL (ref 0.2–1.0)
pH, UA: 6.5 (ref 5.0–7.5)

## 2020-05-12 ENCOUNTER — Telehealth: Payer: Self-pay

## 2020-05-12 NOTE — Telephone Encounter (Signed)
-----   Message from Billey Co, MD sent at 05/12/2020  8:46 AM EDT ----- No blood or infection on urinalysis.  He should call if he would like to proceed with scheduling HOLEP for his BPH and recurrent bleeding.  Otherwise, schedule 1 year follow-up  Nickolas Madrid, MD 05/12/2020

## 2020-06-14 ENCOUNTER — Encounter: Payer: Self-pay | Admitting: Nurse Practitioner

## 2020-06-17 LAB — COMPREHENSIVE METABOLIC PANEL
Albumin: 4.2 (ref 3.5–5.0)
Calcium: 9.4 (ref 8.7–10.7)
GFR calc Af Amer: 83
GFR calc non Af Amer: 71
Globulin: 2.2

## 2020-06-17 LAB — HEPATIC FUNCTION PANEL
ALT: 26 (ref 10–40)
AST: 26 (ref 14–40)
Alkaline Phosphatase: 50 (ref 25–125)
Bilirubin, Total: 2.3

## 2020-06-17 LAB — BASIC METABOLIC PANEL
BUN: 18 (ref 4–21)
CO2: 29 — AB (ref 13–22)
Chloride: 104 (ref 99–108)
Creatinine: 1 (ref 0.6–1.3)
Glucose: 90
Potassium: 4.2 (ref 3.4–5.3)
Sodium: 138 (ref 137–147)

## 2020-06-17 LAB — LIPID PANEL
Cholesterol: 149 (ref 0–200)
HDL: 73 — AB (ref 35–70)
LDL Cholesterol: 63
Triglycerides: 53 (ref 40–160)

## 2020-06-17 LAB — CBC AND DIFFERENTIAL
HCT: 41 (ref 41–53)
Hemoglobin: 14.1 (ref 13.5–17.5)
Neutrophils Absolute: 4249
Platelets: 169 (ref 150–399)
WBC: 7.3

## 2020-06-17 LAB — CBC: RBC: 4.17 (ref 3.87–5.11)

## 2020-06-21 ENCOUNTER — Other Ambulatory Visit: Payer: Self-pay | Admitting: Nurse Practitioner

## 2020-06-22 ENCOUNTER — Encounter: Payer: Self-pay | Admitting: Nurse Practitioner

## 2020-06-22 ENCOUNTER — Ambulatory Visit: Payer: Medicare PPO | Admitting: Nurse Practitioner

## 2020-06-22 ENCOUNTER — Other Ambulatory Visit: Payer: Self-pay

## 2020-06-22 VITALS — BP 110/80 | HR 82 | Temp 98.3°F | Ht 67.0 in | Wt 193.0 lb

## 2020-06-22 DIAGNOSIS — Z9989 Dependence on other enabling machines and devices: Secondary | ICD-10-CM

## 2020-06-22 DIAGNOSIS — K5904 Chronic idiopathic constipation: Secondary | ICD-10-CM

## 2020-06-22 DIAGNOSIS — Z86010 Personal history of colonic polyps: Secondary | ICD-10-CM | POA: Diagnosis not present

## 2020-06-22 DIAGNOSIS — G4733 Obstructive sleep apnea (adult) (pediatric): Secondary | ICD-10-CM

## 2020-06-22 DIAGNOSIS — R350 Frequency of micturition: Secondary | ICD-10-CM

## 2020-06-22 DIAGNOSIS — N401 Enlarged prostate with lower urinary tract symptoms: Secondary | ICD-10-CM | POA: Diagnosis not present

## 2020-06-22 DIAGNOSIS — E782 Mixed hyperlipidemia: Secondary | ICD-10-CM

## 2020-06-22 DIAGNOSIS — I480 Paroxysmal atrial fibrillation: Secondary | ICD-10-CM

## 2020-06-22 NOTE — Progress Notes (Signed)
Careteam: Patient Care Team: Lauree Chandler, NP as PCP - General (Geriatric Medicine)  Advanced Directive information Does Patient Have a Medical Advance Directive?: Yes, Type of Advance Directive: Out of facility DNR (pink MOST or yellow form), Pre-existing out of facility DNR order (yellow form or pink MOST form): Yellow form placed in chart (order not valid for inpatient use), Does patient want to make changes to medical advance directive?: No - Patient declined  No Known Allergies  Chief Complaint  Patient presents with   Medical Management of Chronic Issues    6 month follow up. Discuss lab results.     HPI: Patient is a 75 y.o. male seen in today at the [facility] for routine follow up.   Followed with urologist due to blood in urine. Discussed procedures for BPH but he is not sure if he wants to go ahead with these. Continues on flomax for frequency.   It was recommended to have follow up colonoscopy in 3 years. He was referred to GI but has not gotten this done due to his wife's health.   OSA-on cpap  Hyperlipidemia- controlled on pravachol  A fib- xarelto for anticoagulation- no signs of bleeding and metoprolol for rate control.   He is drinking 3 vodka drinks in the evening.   Review of Systems:  Review of Systems  Constitutional:  Negative for chills, fever and weight loss.  HENT:  Negative for tinnitus.   Respiratory:  Negative for cough, sputum production and shortness of breath.   Cardiovascular:  Negative for chest pain, palpitations and leg swelling.  Gastrointestinal:  Positive for constipation (controlled with softener and fiber). Negative for abdominal pain, diarrhea and heartburn.  Genitourinary:  Positive for frequency. Negative for dysuria and urgency.  Musculoskeletal:  Negative for back pain, falls, joint pain and myalgias.  Skin: Negative.   Neurological:  Negative for dizziness and headaches.  Psychiatric/Behavioral:  Negative for  depression and memory loss. The patient does not have insomnia.    Past Medical History:  Diagnosis Date   Atrial fibrillation [facility])    Per [place] new patient packet   BPH (benign prostatic hyperplasia)    Elevated PSA    Enlarged prostate    High cholesterol    Hx of colonoscopy 2016   Per [place] new patient packet   Hypertension    Sleep apnea    Per [place] new patient packet   Slow urinary stream    Past Surgical History:  Procedure Laterality Date   COLONOSCOPY  07/03/2016   HERNIA REPAIR Right 2010   inguinal hernia    PROSTATE BIOPSY     TONSILLECTOMY     Social History:   reports that he has never smoked. He has never used smokeless tobacco. He reports current alcohol use of about 14.0 standard drinks of alcohol per week. He reports that he does not use drugs.  Family History  Problem Relation Age of Onset   Aneurysm Father    Heart attack Father    Heart disease Father     Medications: Patient's Medications  New Prescriptions   No medications on file  Previous Medications   DOCUSATE CALCIUM (STOOL SOFTENER PO)    Take by mouth daily. As needed   METAXALONE (SKELAXIN) 800 MG TABLET    Take 800 mg by mouth 3 (three) times daily as needed. For pain   METOPROLOL SUCCINATE (TOPROL-XL) 25 MG 24 HR TABLET    TAKE ONE TABLET BY MOUTH  EVERY DAY   PRAVASTATIN (PRAVACHOL) 20 MG TABLET    Take 20 mg by mouth daily.   PSYLLIUM FIBER PO    Take by mouth daily. As needed   RIVAROXABAN (XARELTO) 20 MG TABS TABLET    Take 20 mg by mouth daily.   TAMSULOSIN (FLOMAX) 0.4 MG CAPS CAPSULE    TAKE 1 CAPSULE BY MOUTH EVERY DAY  Modified Medications   No medications on file  Discontinued Medications   No medications on file    Physical Exam:  Vitals:   06/22/20 1004  BP: 110/80  Pulse: 82  Temp: 98.3 F (36.8 C)  TempSrc: Oral  SpO2: 98%  Weight: 193 lb (87.5 kg)  Height: 5\' 7"  (1.702 m)   Body mass index is 30.23 kg/m. Wt Readings from Last 3 Encounters:  06/22/20 193 lb  (87.5 kg)  05/10/20 186 lb (84.4 kg)  12/23/19 188 lb 8 oz (85.5 kg)    Physical Exam Constitutional:      General: He is not in acute distress.    Appearance: He is well-developed. He is not diaphoretic.  HENT:     Head: Normocephalic and atraumatic.     Right Ear: External ear normal.     Left Ear: External ear normal.     Mouth/Throat:     Pharynx: No oropharyngeal exudate.  Eyes:     Conjunctiva/sclera: Conjunctivae normal.     Pupils: Pupils are equal, round, and reactive to light.  Cardiovascular:     Rate and Rhythm: Normal rate and regular rhythm.     Heart sounds: Normal heart sounds.  Pulmonary:     Effort: Pulmonary effort is normal.     Breath sounds: Normal breath sounds.  Abdominal:     General: Bowel sounds are normal.     Palpations: Abdomen is soft.  Musculoskeletal:        General: No tenderness.     Cervical back: Normal range of motion and neck supple.     Right lower leg: No edema.     Left lower leg: No edema.  Skin:    General: Skin is warm and dry.  Neurological:     Mental Status: He is alert and oriented to person, place, and time.    Labs reviewed: Basic Metabolic Panel: Recent Labs    12/18/19 0000  NA 139  K 4.4  CL 102  CO2 28*  BUN 22*  CREATININE 0.9  CALCIUM 9.5   Liver Function Tests: Recent Labs    12/18/19 0000  AST 27  ALT 29  ALKPHOS 58  ALBUMIN 4.3   No results for input(s): LIPASE, AMYLASE in the last 8760 hours. No results for input(s): AMMONIA in the last 8760 hours. CBC: Recent Labs    12/18/19 0000  WBC 7.2  HGB 14.5  HCT 42  PLT 195   Lipid Panel: No results for input(s): CHOL, HDL, LDLCALC, TRIG, CHOLHDL, LDLDIRECT in the last 8760 hours. TSH: No results for input(s): TSH in the last 8760 hours. A1C: No results found for: HGBA1C   Assessment/Plan 1. Benign prostatic hyperplasia with urinary frequency -stable. Followed by urology and on flomax 0.4 mg daily  2. History of adenomatous polyp  of colon -plans to schedule colonoscopy  3. Paroxysmal atrial fibrillation (HCC) -rate controlled, continues on xarelto 20 mg daily for anticoagulation and rate controlled on metoprolol   4. OSA on CPAP Stable, reports compliance.   5. Mixed hyperlipidemia Lipids at goal. Continue on statin.  6. Chronic idiopathic constipation Controlled with OTC  Next appt: 6 months, labs prior [place] Nydam K. Fouke, [facility] [phone]

## 2020-06-29 ENCOUNTER — Other Ambulatory Visit: Payer: Self-pay | Admitting: Nurse Practitioner

## 2020-09-09 ENCOUNTER — Telehealth: Payer: Self-pay

## 2020-09-09 ENCOUNTER — Encounter: Payer: Self-pay | Admitting: Nurse Practitioner

## 2020-09-09 ENCOUNTER — Ambulatory Visit (INDEPENDENT_AMBULATORY_CARE_PROVIDER_SITE_OTHER): Payer: Medicare PPO | Admitting: Nurse Practitioner

## 2020-09-09 ENCOUNTER — Other Ambulatory Visit: Payer: Self-pay

## 2020-09-09 DIAGNOSIS — Z0000 Encounter for general adult medical examination without abnormal findings: Secondary | ICD-10-CM | POA: Diagnosis not present

## 2020-09-09 NOTE — Progress Notes (Signed)
Subjective:   Randy Montgomery is a 75 y.o. male who presents for Medicare Annual/Subsequent preventive examination.  Review of Systems     Cardiac Risk Factors include: obesity (BMI >30kg/m2);hypertension;dyslipidemia;advanced age (>62mn, >>64women)     Objective:    There were no vitals filed for this visit. There is no height or weight on file to calculate BMI.  Advanced Directives 09/09/2020 06/22/2020 12/23/2019  Does Patient Have a Medical Advance Directive? Yes Yes Yes  Type of Advance Directive Out of facility DNR (pink MOST or yellow form) Out of facility DNR (pink MOST or yellow form) Out of facility DNR (pink MOST or yellow form)  Does patient want to make changes to medical advance directive? No - Patient declined No - Patient declined No - Patient declined  Pre-existing out of facility DNR order (yellow form or pink MOST form) Yellow form placed in chart (order not valid for inpatient use) Yellow form placed in chart (order not valid for inpatient use) Yellow form placed in chart (order not valid for inpatient use)    Current Medications (verified) Outpatient Encounter Medications as of 09/09/2020  Medication Sig   Docusate Calcium (STOOL SOFTENER PO) Take by mouth daily. As needed   metaxalone (SKELAXIN) 800 MG tablet Take 800 mg by mouth 3 (three) times daily as needed. For pain   metoprolol succinate (TOPROL-XL) 25 MG 24 hr tablet TAKE ONE TABLET BY MOUTH EVERY DAY   pravastatin (PRAVACHOL) 20 MG tablet TAKE ONE TABLET BY MOUTH EVERY DAY   PSYLLIUM FIBER PO Take by mouth daily. As needed   tamsulosin (FLOMAX) 0.4 MG CAPS capsule TAKE 1 CAPSULE BY MOUTH EVERY DAY   XARELTO 20 MG TABS tablet TAKE ONE TABLET BY MOUTH EVERY DAY WITH EVENING MEAL   No facility-administered encounter medications on file as of 09/09/2020.    Allergies (verified) Patient has no known allergies.   History: Past Medical History:  Diagnosis Date   [place]    [facility] [place]   BPH (benign prostatic hyperplasia)    Elevated PSA    Enlarged prostate    High cholesterol    Hx of colonoscopy 2016   Per [facility] new patient packet   Hypertension    [place] apnea    [place] [place]   Slow urinary stream    Past Surgical History:  Procedure Laterality Date   COLONOSCOPY  07/03/2016   HERNIA REPAIR Right 2010   inguinal hernia    PROSTATE BIOPSY     TONSILLECTOMY     Family History  Problem Relation Age of Onset   Aneurysm Father    Heart attack Father    Heart disease Father    Social History   Socioeconomic History   Marital status: Married    Spouse name: Not on file   Number of children: Not on file   Years of education: Not on file   Highest education level: Not on file  Occupational History   Not on file  Tobacco Use   Smoking status: Never   Smokeless tobacco: Never  Vaping Use   Vaping Use: Never used  Substance and Sexual Activity   Alcohol use: Yes    Alcohol/week: 14.0 standard drinks    Types: 14 Standard drinks or equivalent per week    Comment: burbon 3 drinks a night    Drug use: Never   Sexual activity: Not on file  Other Topics Concern   Not  on file  Social History Narrative   Diet      Do you drink/eat things with caffeine Yes      Marital Status Married  What year were you married? 1968      Do you live in a house, apartment, assisted living, condo, trailer, etc.? Assisted living      Is it one or more stories? 1      How many persons live in your home? 2         Do you have any pets in your home?(please list): No      Highest level of education completed: BA, [employer] 1967      Current or past profession: Civil engineer, contracting      Do you exercise?: Moderately  Type and how often: Wak, use wellnes equipment 2-3 times a week      Living Will?      DNR form? Yes    If not, do you wish to discuss one?       POA/HPOA forms? Yes      Difficulty bathing or dressing yourself? Patient did not answer  these questions on new patient packet      Difficulty preparing food or eating?      Difficulty managing medications?      Difficulty managing your finances?      Difficulty affording your medications?                     Social Determinants of Health   Financial Resource Strain: Not on file  Food Insecurity: Not on file  Transportation Needs: Not on file  Physical Activity: Not on file  Stress: Not on file  Social Connections: Not on file    Tobacco Counseling Counseling given: Not Answered   Clinical Intake:  Pre-visit preparation completed: Yes  Pain : No/denies pain     BMI - recorded: 30 Nutritional Status: BMI > 30  Obese Nutritional Risks: None  How often do you need to have someone help you when you read instructions, pamphlets, or other written materials from your doctor or pharmacy?: 1 - Never  Diabetic?no         Activities of Daily Living In your present state of health, do you have any difficulty performing the following activities: 09/09/2020  Hearing? Y  Vision? N  Difficulty concentrating or making decisions? N  Walking or climbing stairs? N  Dressing or bathing? N  Doing errands, shopping? N  Preparing Food and eating ? N  Using the Toilet? N  In the past six months, have you accidently leaked urine? N  Do you have problems with loss of bowel control? N  Managing your Medications? N  Managing your Finances? N  Some recent data might be hidden    Patient Care Team: Lauree Chandler, NP as PCP - General (Geriatric Medicine)  Indicate any recent Medical Services you may have received from other than [facility] providers in the past year (date may be approximate).     Assessment:   This is a routine wellness examination for Randy Montgomery.  Hearing/Vision screen Hearing Screening - Comments:: Patient wears hearing aids. Vision Screening - Comments:: Patient wears glasses. Patient had eye exam within past year. Patient sees Dr. Everardo Pacific  Dietary issues and exercise activities discussed: Current Exercise Habits: Home exercise routine, Type of exercise: calisthenics;walking, Time (Minutes): 25, Frequency (Times/Week): 3, Weekly Exercise (Minutes/Week): 75   Goals Addressed   None    Depression  Screen PHQ 2/9 Scores 09/09/2020 07/24/2019  PHQ - 2 Score 0 0    Fall Risk Fall Risk  09/09/2020 07/24/2019  Falls in the past year? 0 0  Number falls in past yr: 0 0  Injury with Fall? 0 0  Risk for fall due to : No Fall Risks -  Follow up Falls evaluation completed -    FALL RISK PREVENTION PERTAINING TO THE HOME:  Any stairs in or around the home? Yes  If so, are there any without handrails? No  Home free of loose throw rugs in walkways, pet beds, electrical cords, etc? Yes  Adequate lighting in your home to reduce risk of falls? Yes   ASSISTIVE DEVICES UTILIZED TO PREVENT FALLS:  Life alert? No  Use of a cane, walker or w/c? No  Grab bars in the bathroom? Yes  Shower chair or bench in shower? Yes  Elevated toilet seat or a handicapped toilet? Yes   TIMED UP AND GO:  Was the test performed? No .    Cognitive Function:     6CIT Screen 09/09/2020  What Year? 0 points  What month? 0 points  What time? 0 points  Count back from 20 0 points  Months in reverse 2 points  Repeat phrase 0 points  Total Score 2    Immunizations Immunization History  Administered Date(s) Administered   Influenza Split 10/24/2011   Influenza, High Dose Seasonal PF 10/01/2014, 09/22/2015   Influenza, Seasonal, Injecte, Preservative Fre 12/06/1999, 09/26/2012   Influenza,inj,Quad PF,6+ Mos 09/23/2013, 10/01/2014, 10/04/2016, 10/23/2017, 10/24/2017   Influenza-Unspecified 09/23/2013, 10/21/2019   Moderna Sars-Covid-2 Vaccination 01/16/2019, 02/13/2019, 11/18/2019, 05/20/2020   Pneumococcal Conjugate-13 11/07/2012   Pneumococcal Polysaccharide-23 06/16/2008, 06/25/2009   Td 04/15/1992, 04/05/2002   Tdap 12/16/2010   Tetanus  04/05/2002   Zoster Recombinat (Shingrix) 09/17/2017, 12/19/2017   Zoster, Live 06/10/2007    TDAP status: Up to date  Flu Vaccine status: Due, Education has been provided regarding the importance of this vaccine. Advised may receive this vaccine at local pharmacy or Health Dept. Aware to provide a copy of the vaccination record if obtained from local pharmacy or Health Dept. Verbalized acceptance and understanding.  Pneumococcal vaccine status: Up to date  Covid-19 vaccine status: Information provided on how to obtain vaccines.   Qualifies for Shingles Vaccine? Yes   Zostavax completed Yes   Shingrix Completed?: Yes  Screening Tests Health Maintenance  Topic Date Due   INFLUENZA VACCINE  08/02/2020   TETANUS/TDAP  12/15/2020   COVID-19 Vaccine  Completed   Hepatitis C Screening  Completed   PNA vac Low Risk Adult  Completed   Zoster Vaccines- Shingrix  Completed   HPV VACCINES  Aged Out    Health Maintenance  Health Maintenance Due  Topic Date Due   INFLUENZA VACCINE  08/02/2020    Colorectal cancer screening: No longer required.   Lung Cancer Screening: (Low Dose CT Chest recommended if Age 61-80 years, 30 pack-year currently smoking OR have quit w/in 15years.) does not qualify.   Lung Cancer Screening Referral: na  Additional Screening:  Hepatitis C Screening: does qualify; Completed 2016  Vision Screening: Recommended annual ophthalmology exams for early detection of glaucoma and other disorders of the eye. Is the patient up to date with their annual eye exam?  Yes  Who is the provider or what is the name of the office in which the patient attends annual eye exams? [facility] If pt is not established with a provider, would they  like to be referred to a provider to establish care? No .   Dental Screening: Recommended annual dental exams for proper oral hygiene  Community Resource Referral / Chronic Care Management: CRR required this visit?  No   CCM required this  visit?  No      Plan:     I have personally reviewed and noted the following in the patient's chart:   Medical and social history Use of alcohol, tobacco or illicit drugs  Current medications and supplements including opioid prescriptions. Patient is not currently taking opioid prescriptions. Functional ability and status Nutritional status Physical activity Advanced directives List of other physicians Hospitalizations, surgeries, and ER visits in previous 12 months Vitals Screenings to include cognitive, depression, and falls Referrals and appointments  In addition, I have reviewed and discussed with patient certain preventive protocols, quality metrics, and best practice recommendations. A written personalized care plan for preventive services as well as general preventive health recommendations were provided to patient.     Lauree Chandler, NP   09/09/2020    Virtual Visit via Telephone Note  I connected withNAME@ on 09/09/20 at 10:00 AM EDT by telephone and verified that I am speaking with the correct person using two identifiers.  Location: Patient: home Provider: [facility]   I discussed the limitations, risks, security and privacy concerns of performing an evaluation and management service by telephone and the availability of in person appointments. I also discussed with the patient that there may be a patient responsible charge related to this service. The patient expressed understanding and agreed to proceed.   I discussed the assessment and treatment plan with the patient. The patient was provided an opportunity to ask questions and all were answered. The patient agreed with the plan and demonstrated an understanding of the instructions.   The patient was advised to call back or seek an in-person evaluation if the symptoms worsen or if the condition fails to improve as anticipated.  I provided 18 minutes of non-face-to-face time during this encounter.  Carlos American. Harle Battiest Avs printed and mailed

## 2020-09-09 NOTE — Progress Notes (Signed)
This service is provided via telemedicine  No vital signs collected/recorded due to the encounter was a telemedicine visit.   Location of patient (ex: home, work):  Home  Patient consents to a telephone visit:  Yes, see encounter dated 09/09/2020  Location of the provider (ex: office, home):  [place]  Name of any referring provider:  N/A  Names of all persons participating in the telemedicine service and their role in the encounter:  Sherrie Mustache, Nurse Practitioner, [name], CMA, and patient.   Time spent on call:  13 minutes with medical assistant

## 2020-09-09 NOTE — Telephone Encounter (Signed)
[name], rajaram are scheduled for a virtual visit with your provider today.    Just as we do with appointments in the office, we must obtain your consent to participate.  Your consent will be active for this visit and any virtual visit you may have with one of our providers in the next 365 days.    If you have a MyChart account, I can also send a copy of this consent to you electronically.  All virtual visits are billed to your insurance company just like a traditional visit in the office.  As this is a virtual visit, video technology does not allow for your provider to perform a traditional examination.  This may limit your provider's ability to fully assess your condition.  If your provider identifies any concerns that need to be evaluated in person or the need to arrange testing such as labs, EKG, etc, we will make arrangements to do so.    Although advances in technology are sophisticated, we cannot ensure that it will always work on either your end or our end.  If the connection with a video visit is poor, we may have to switch to a telephone visit.  With either a video or telephone visit, we are not always able to ensure that we have a secure connection.   I need to obtain your verbal consent now.   Are you willing to proceed with your visit today?   Randy Montgomery has provided verbal consent on 09/09/2020 for a virtual visit (video or telephone).   [name], CMA 09/09/2020  10:02 AM

## 2020-09-09 NOTE — Patient Instructions (Signed)
Randy Montgomery , Thank you for taking time to come for your Medicare Wellness Visit. I appreciate your ongoing commitment to your health goals. Please review the following plan we discussed and let me know if I can assist you in the future.   Screening recommendations/referrals: Colonoscopy up to date Recommended yearly ophthalmology/optometry visit for glaucoma screening and checkup Recommended yearly dental visit for hygiene and checkup  Vaccinations: Influenza vaccine RECOMMENDED at this time   Pneumococcal vaccine up to date Tdap vaccine up to date Shingles vaccine up to date COVID vaccine- recommended     Advanced directives: recommended to bring to place on file.   Conditions/risks identified: advanced age  Next appointment: yearly for AWV   Preventive Care 93 Years and Older, Male Preventive care refers to lifestyle choices and visits with your health care provider that can promote health and wellness. What does preventive care include? A yearly physical exam. This is also called an annual well check. Dental exams once or twice a year. Routine eye exams. Ask your health care provider how often you should have your eyes checked. Personal lifestyle choices, including: Daily care of your teeth and gums. Regular physical activity. Eating a healthy diet. Avoiding tobacco and drug use. Limiting alcohol use. Practicing safe sex. Taking low doses of aspirin every day. Taking vitamin and mineral supplements as recommended by your health care provider. What happens during an annual well check? The services and screenings done by your health care provider during your annual well check will depend on your age, overall health, lifestyle risk factors, and family history of disease. Counseling  Your health care provider may ask you questions about your: Alcohol use. Tobacco use. Drug use. Emotional well-being. Home and relationship well-being. Sexual activity. Eating  habits. History of falls. Memory and ability to understand (cognition). Work and work Statistician. Screening  You may have the following tests or measurements: Height, weight, and BMI. Blood pressure. Lipid and cholesterol levels. These may be checked every 5 years, or more frequently if you are over 15 years old. Skin check. Lung cancer screening. You may have this screening every year starting at age 108 if you have a 30-pack-year history of smoking and currently smoke or have quit within the past 15 years. Fecal occult blood test (FOBT) of the stool. You may have this test every year starting at age 48. Flexible sigmoidoscopy or colonoscopy. You may have a sigmoidoscopy every 5 years or a colonoscopy every 10 years starting at age 72. Prostate cancer screening. Recommendations will vary depending on your family history and other risks. Hepatitis C blood test. Hepatitis B blood test. Sexually transmitted disease (STD) testing. Diabetes screening. This is done by checking your blood sugar (glucose) after you have not eaten for a while (fasting). You may have this done every 1-3 years. Abdominal aortic aneurysm (AAA) screening. You may need this if you are a current or former smoker. Osteoporosis. You may be screened starting at age 17 if you are at high risk. Talk with your health care provider about your test results, treatment options, and if necessary, the need for more tests. Vaccines  Your health care provider may recommend certain vaccines, such as: Influenza vaccine. This is recommended every year. Tetanus, diphtheria, and acellular pertussis (Tdap, Td) vaccine. You may need a Td booster every 10 years. Zoster vaccine. You may need this after age 25. Pneumococcal 13-valent conjugate (PCV13) vaccine. One dose is recommended after age 3. Pneumococcal polysaccharide (PPSV23) vaccine. One dose is  recommended after age 70. Talk to your health care provider about which screenings and  vaccines you need and how often you need them. This information is not intended to replace advice given to you by your health care provider. Make sure you discuss any questions you have with your health care provider. Document Released: 01/15/2015 Document Revised: 09/08/2015 Document Reviewed: 10/20/2014 Elsevier Interactive Patient Education  2017 [place] Prevention in the Home Falls can cause injuries. They can happen to people of all ages. There are many things you can do to make your home safe and to help prevent falls. What can I do on the outside of my home? Regularly fix the edges of walkways and driveways and fix any cracks. Remove anything that might make you trip as you walk through a door, such as a raised step or threshold. Trim any bushes or trees on the path to your home. Use bright outdoor lighting. Clear any walking paths of anything that might make someone trip, such as rocks or tools. Regularly check to see if handrails are loose or broken. Make sure that both sides of any steps have handrails. Any raised decks and porches should have guardrails on the edges. Have any leaves, snow, or ice cleared regularly. Use sand or salt on walking paths during winter. Clean up any spills in your garage right away. This includes oil or grease spills. What can I do in the bathroom? Use night lights. Install grab bars by the toilet and in the tub and shower. Do not use towel bars as grab bars. Use non-skid mats or decals in the tub or shower. If you need to sit down in the shower, use a plastic, non-slip stool. Keep the floor dry. Clean up any water that spills on the floor as soon as it happens. Remove soap buildup in the tub or shower regularly. Attach bath mats securely with double-sided non-slip rug tape. Do not have throw rugs and other things on the floor that can make you trip. What can I do in the bedroom? Use night lights. Make sure that you have a light by your  bed that is easy to reach. Do not use any sheets or blankets that are too big for your bed. They should not hang down onto the floor. Have a firm chair that has side arms. You can use this for support while you get dressed. Do not have throw rugs and other things on the floor that can make you trip. What can I do in the kitchen? Clean up any spills right away. Avoid walking on wet floors. Keep items that you use a lot in easy-to-reach places. If you need to reach something above you, use a strong step stool that has a grab bar. Keep electrical cords out of the way. Do not use floor polish or wax that makes floors slippery. If you must use wax, use non-skid floor wax. Do not have throw rugs and other things on the floor that can make you trip. What can I do with my stairs? Do not leave any items on the stairs. Make sure that there are handrails on both sides of the stairs and use them. Fix handrails that are broken or loose. Make sure that handrails are as long as the stairways. Check any carpeting to make sure that it is firmly attached to the stairs. Fix any carpet that is loose or worn. Avoid having throw rugs at the top or bottom of the stairs. If  you do have throw rugs, attach them to the floor with carpet tape. Make sure that you have a light switch at the top of the stairs and the bottom of the stairs. If you do not have them, ask someone to add them for you. What else can I do to help prevent falls? Wear shoes that: Do not have high heels. Have rubber bottoms. Are comfortable and fit you well. Are closed at the toe. Do not wear sandals. If you use a stepladder: Make sure that it is fully opened. Do not climb a closed stepladder. Make sure that both sides of the stepladder are locked into place. Ask someone to hold it for you, if possible. Clearly mark and make sure that you can see: Any grab bars or handrails. First and last steps. Where the edge of each step is. Use tools that  help you move around (mobility aids) if they are needed. These include: Canes. Walkers. Scooters. Crutches. Turn on the lights when you go into a dark area. Replace any light bulbs as soon as they burn out. Set up your furniture so you have a clear path. Avoid moving your furniture around. If any of your floors are uneven, fix them. If there are any pets around you, be aware of where they are. Review your medicines with your doctor. Some medicines can make you feel dizzy. This can increase your chance of falling. Ask your doctor what other things that you can do to help prevent falls. This information is not intended to replace advice given to you by your health care provider. Make sure you discuss any questions you have with your health care provider. Document Released: 10/15/2008 Document Revised: 05/27/2015 Document Reviewed: 01/23/2014 Elsevier Interactive Patient Education  2017 [employer].

## 2020-09-20 ENCOUNTER — Other Ambulatory Visit: Payer: Self-pay | Admitting: Nurse Practitioner

## 2020-09-21 ENCOUNTER — Telehealth: Payer: Self-pay

## 2020-09-21 NOTE — Telephone Encounter (Signed)
Pt. Ready to schedule colonoscopy 

## 2020-09-22 ENCOUNTER — Telehealth: Payer: Self-pay | Admitting: *Deleted

## 2020-09-22 ENCOUNTER — Other Ambulatory Visit (INDEPENDENT_AMBULATORY_CARE_PROVIDER_SITE_OTHER): Payer: Self-pay

## 2020-09-22 ENCOUNTER — Other Ambulatory Visit: Payer: Self-pay

## 2020-09-22 DIAGNOSIS — Z86010 Personal history of colonic polyps: Secondary | ICD-10-CM

## 2020-09-22 MED ORDER — PEG 3350-KCL-NA BICARB-NACL 420 G PO SOLR: 4000.0000 mL | Freq: Once | ORAL | 0 refills | Status: AC

## 2020-09-22 NOTE — Telephone Encounter (Signed)
Received fax from [facility] with [facility] [phone] [phone] regarding Blood Thinner Information Request.  Patient has been scheduled for a Colonoscopy procedure on 10/06/2020.  Patient is currently taking Xarelto 20mg  and they need to know if he needs to [facility] and when to restart it back or if he needs to continue taking.   Form placed in [place] folder to review and sign.  To be faxed back to [facility] with [facility] once completed.

## 2020-09-22 NOTE — Progress Notes (Signed)
Gastroenterology Pre-Procedure Review  Request Date: 1005/2022 Requesting Physician: Dr. Vicente Males  PATIENT REVIEW QUESTIONS: The patient responded to the following health history questions as indicated:    1. Are you having any GI issues? no 2. Do you have a personal history of Polyps? yes (removed some last colonoscopy) 3. Do you have a family history of Colon Cancer or Polyps? no 4. Diabetes Mellitus? no 5. Joint replacements in the past 12 months?no 6. Major health problems in the past 3 months?yes (hernia) 7. Any artificial heart valves, MVP, or defibrillator?no but has afib    MEDICATIONS & ALLERGIES:    Patient reports the following regarding taking any anticoagulation/antiplatelet therapy:   Plavix, Coumadin, Eliquis, Xarelto, Lovenox, Pradaxa, Brilinta, or Effient? yes (xarelto) Aspirin? no  Patient confirms/reports the following medications:  Current Outpatient Medications  Medication Sig Dispense Refill   Docusate Calcium (STOOL SOFTENER PO) Take by mouth daily. As needed     metaxalone (SKELAXIN) 800 MG tablet Take 800 mg by mouth 3 (three) times daily as needed. For pain     metoprolol succinate (TOPROL-XL) 25 MG 24 hr tablet TAKE ONE TABLET BY MOUTH EVERY DAY 90 tablet 1   pravastatin (PRAVACHOL) 20 MG tablet TAKE ONE TABLET BY MOUTH EVERY DAY 90 tablet 1   PSYLLIUM FIBER PO Take by mouth daily. As needed     tamsulosin (FLOMAX) 0.4 MG CAPS capsule TAKE 1 CAPSULE BY MOUTH EVERY DAY 90 capsule 1   XARELTO 20 MG TABS tablet TAKE ONE TABLET BY MOUTH EVERY DAY WITH EVENING MEAL 90 tablet 1   No current facility-administered medications for this visit.    Patient confirms/reports the following allergies:  No Known Allergies  No orders of the defined types were placed in this encounter.   AUTHORIZATION INFORMATION Primary Insurance: 1D#: Group #:  Secondary Insurance: 1D#: Group #:  SCHEDULE INFORMATION: Date: 10/06/2020 Time: Location: [facility]

## 2020-09-22 NOTE — Progress Notes (Signed)
Sending blood thinner clearance

## 2020-09-23 ENCOUNTER — Telehealth: Payer: Self-pay

## 2020-09-23 NOTE — Telephone Encounter (Signed)
Called patient about his blood thinner clearance he understands to stop 48 hours prior and can restart after procedure if no problems

## 2020-10-06 ENCOUNTER — Ambulatory Visit
Admission: RE | Admit: 2020-10-06 | Discharge: 2020-10-06 | Disposition: A | Discharge status: Home / Self Care | Payer: Medicare PPO | Attending: Gastroenterology | Admitting: Gastroenterology

## 2020-10-06 ENCOUNTER — Ambulatory Visit: Payer: Medicare PPO | Admitting: Anesthesiology

## 2020-10-06 ENCOUNTER — Encounter: Payer: Self-pay | Admitting: Gastroenterology

## 2020-10-06 ENCOUNTER — Encounter
Admission: RE | Disposition: A | Discharge status: Home / Self Care | Payer: Self-pay | Source: Home / Self Care | Attending: Gastroenterology

## 2020-10-06 DIAGNOSIS — D126 Benign neoplasm of colon, unspecified: Secondary | ICD-10-CM | POA: Diagnosis not present

## 2020-10-06 DIAGNOSIS — D120 Benign neoplasm of cecum: Secondary | ICD-10-CM | POA: Diagnosis not present

## 2020-10-06 DIAGNOSIS — Z79899 Other long term (current) drug therapy: Secondary | ICD-10-CM | POA: Insufficient documentation

## 2020-10-06 DIAGNOSIS — Z860100 Personal history of colon polyps, unspecified: Secondary | ICD-10-CM

## 2020-10-06 DIAGNOSIS — K5730 Diverticulosis of large intestine without perforation or abscess without bleeding: Secondary | ICD-10-CM | POA: Insufficient documentation

## 2020-10-06 DIAGNOSIS — Z1211 Encounter for screening for malignant neoplasm of colon: Secondary | ICD-10-CM | POA: Insufficient documentation

## 2020-10-06 HISTORY — PX: COLONOSCOPY WITH PROPOFOL: SHX5780

## 2020-10-06 SURGERY — COLONOSCOPY WITH PROPOFOL
Anesthesia: General

## 2020-10-06 MED ORDER — PROPOFOL 10 MG/ML IV BOLUS
INTRAVENOUS | Status: DC | PRN
  Administered 2020-10-06: 70 mg via INTRAVENOUS

## 2020-10-06 MED ORDER — LIDOCAINE HCL (CARDIAC) PF 100 MG/5ML IV SOSY
PREFILLED_SYRINGE | INTRAVENOUS | Status: DC | PRN
  Administered 2020-10-06: 50 mg via INTRAVENOUS

## 2020-10-06 MED ORDER — PROPOFOL 500 MG/50ML IV EMUL
INTRAVENOUS | Status: AC
  Filled 2020-10-06: qty 50

## 2020-10-06 MED ORDER — PROPOFOL 500 MG/50ML IV EMUL
INTRAVENOUS | Status: DC | PRN
  Administered 2020-10-06: 150 ug/kg/min via INTRAVENOUS

## 2020-10-06 MED ORDER — SODIUM CHLORIDE 0.9 % IV SOLN: INTRAVENOUS | Status: DC

## 2020-10-06 MED ORDER — LIDOCAINE HCL (PF) 2 % IJ SOLN
INTRAMUSCULAR | Status: AC
  Filled 2020-10-06: qty 5

## 2020-10-06 NOTE — Anesthesia Preprocedure Evaluation (Signed)
Anesthesia Evaluation  Patient identified by MRN, date of birth, ID band Patient awake    Reviewed: Allergy & Precautions, NPO status , Patient's Chart, lab work & pertinent test results  History of Anesthesia Complications Negative for: history of anesthetic complications  Airway Mallampati: III  TM Distance: >3 FB Neck ROM: Full    Dental no notable dental hx.    Pulmonary sleep apnea and Continuous Positive Airway Pressure Ventilation , neg COPD,    breath sounds clear to auscultation- rhonchi (-) wheezing      Cardiovascular hypertension, Pt. on medications (-) CAD, (-) Past MI, (-) Cardiac Stents and (-) CABG + dysrhythmias Atrial Fibrillation  Rhythm:Regular Rate:Normal - Systolic murmurs and - Diastolic murmurs    Neuro/Psych neg Seizures negative neurological ROS  negative psych ROS   GI/Hepatic negative GI ROS, Neg liver ROS,   Endo/Other  negative endocrine ROSneg diabetes  Renal/GU negative Renal ROS     Musculoskeletal negative musculoskeletal ROS (+)   Abdominal (+) - obese,   Peds  Hematology negative hematology ROS (+)   Anesthesia Other Findings Past Medical History: No date: Atrial fibrillation (HCC)     Comment:  Per [facility] new patient packet No date: BPH (benign prostatic hyperplasia) No date: Elevated PSA No date: Enlarged prostate No date: High cholesterol 2016: Hx of colonoscopy     Comment:  Per [facility] new patient packet No date: Hypertension No date: Sleep apnea     Comment:  Per [facility] new patient packet No date: Slow urinary stream   Reproductive/Obstetrics                             Anesthesia Physical Anesthesia Plan  ASA: 3  Anesthesia Plan: General   Post-op Pain Management:    Induction: Intravenous  PONV Risk Score and Plan: 1 and Propofol infusion  Airway Management Planned: Natural Airway  Additional Equipment:   Intra-op Plan:    Post-operative Plan:   Informed Consent: I have reviewed the patients History and Physical, chart, labs and discussed the procedure including the risks, benefits and alternatives for the proposed anesthesia with the patient or authorized representative who has indicated his/her understanding and acceptance.     Dental advisory given  Plan Discussed with: CRNA and Anesthesiologist  Anesthesia Plan Comments:         Anesthesia Quick Evaluation

## 2020-10-06 NOTE — Op Note (Signed)
[facility] Gastroenterology Patient Name: Randy Montgomery Procedure Date: 10/06/2020 12:12 PM MRN: [id number] Account #: [id number] Date of Birth: [birth year] Admit Type: Outpatient Age: 75 Room: [facility] ENDO ROOM 3 Gender: Male Note Status: Finalized Instrument Name: Jasper Riling [id number] Procedure:             Colonoscopy Indications:           Surveillance: Personal history of adenomatous polyps                         on last colonoscopy > 3 years ago Providers:             Jonathon Bellows MD, MD Referring MD:          Carlos American. Dewaine Oats (Referring MD) Medicines:             Monitored Anesthesia Care Complications:         No immediate complications. Procedure:             Pre-Anesthesia Assessment:                        - Prior to the procedure, a History and Physical was                         performed, and patient medications, allergies and                         sensitivities were reviewed. The patient's tolerance                         of previous anesthesia was reviewed.                        - The risks and benefits of the procedure and the                         sedation options and risks were discussed with the                         patient. All questions were answered and informed                         consent was obtained.                        - ASA Grade Assessment: II - A patient with mild                         systemic disease.                        After obtaining informed consent, the colonoscope was                         passed under direct vision. Throughout the procedure,                         the patient's blood pressure, pulse, and oxygen  saturations were monitored continuously. The                         Colonoscope was introduced through the anus and                         advanced to the the cecum, identified by the                         appendiceal orifice. The colonoscopy was performed                          with ease. The patient tolerated the procedure well.                         The quality of the bowel preparation was good. Findings:      The perianal and digital rectal examinations were normal.      Multiple small and large-mouthed diverticula were found in the left       colon.      Three sessile polyps were found in the ascending colon. The polyps were       7 to 10 mm in size. These polyps were removed with a cold snare.       Resection and retrieval were complete.      A 5 mm polyp was found in the cecum. The polyp was sessile. The polyp       was removed with a cold snare. Resection and retrieval were complete.      The exam was otherwise without abnormality on direct and retroflexion       views. Impression:            - Diverticulosis in the left colon.                        - Three 7 to 10 mm polyps in the ascending colon,                         removed with a cold snare. Resected and retrieved.                        - One 5 mm polyp in the cecum, removed with a cold                         snare. Resected and retrieved.                        - The examination was otherwise normal on direct and                         retroflexion views. Recommendation:        - Discharge patient to home (with escort).                        - Resume previous diet.                        - Continue present medications.                        -  Await pathology results.                        - Repeat colonoscopy is not recommended due to current                         age (39 years or older) for surveillance based on                         pathology results. Procedure Code(s):     --- Professional ---                        [phone], Colonoscopy, flexible; with removal of                         tumor(s), polyp(s), or other lesion(s) by snare                         technique Diagnosis Code(s):     --- Professional ---                        K63.5, Polyp of colon                         Z86.010, Personal history of colonic polyps                        K57.30, Diverticulosis of large intestine without                         perforation or abscess without bleeding CPT copyright 2019 American Medical Association. All rights reserved. The codes documented in this report are preliminary and upon coder review may  be revised to meet current compliance requirements. Jonathon Bellows, MD Jonathon Bellows MD, MD 10/06/2020 1:21:00 PM This report has been signed electronically. Number of Addenda: 0 Note Initiated On: 10/06/2020 12:12 PM Scope Withdrawal Time: 0 hours 15 minutes 19 seconds  Total Procedure Duration: 0 hours 18 minutes 56 seconds  Estimated Blood Loss:  Estimated blood loss: none.      [facility]

## 2020-10-06 NOTE — Anesthesia Postprocedure Evaluation (Signed)
Anesthesia Post Note  Patient: Randy Montgomery  Procedure(s) Performed: COLONOSCOPY WITH PROPOFOL  Patient location during evaluation: Endoscopy Anesthesia Type: General Level of consciousness: awake and alert and oriented Pain management: pain level controlled Vital Signs Assessment: post-procedure vital signs reviewed and stable Respiratory status: spontaneous breathing, nonlabored ventilation and respiratory function stable Cardiovascular status: blood pressure returned to baseline and stable Postop Assessment: no signs of nausea or vomiting Anesthetic complications: no   No notable events documented.   Last Vitals:  Vitals:   10/06/20 1322 10/06/20 1332  BP: (!) 104/56 97/62  Pulse: 71 63  Resp: 18 18  Temp: (!) 36.4 C   SpO2: 94% 97%    Last Pain:  Vitals:   10/06/20 1322  TempSrc: Temporal  PainSc: Asleep                 Makaylia Hewett

## 2020-10-06 NOTE — Transfer of Care (Signed)
Immediate Anesthesia Transfer of Care Note  Patient: Randy Montgomery  Procedure(s) Performed: COLONOSCOPY WITH PROPOFOL  Patient Location: PACU  Anesthesia Type:General  Level of Consciousness: sedated  Airway & Oxygen Therapy: Patient Spontanous Breathing  Post-op Assessment: Report given to RN and Post -op Vital signs reviewed and stable  Post vital signs: Reviewed and stable  Last Vitals:  Vitals Value Taken Time  BP 104/56 10/06/20 1322  Temp    Pulse 71 10/06/20 1322  Resp 18 10/06/20 1322  SpO2 94 % 10/06/20 1322    Last Pain:  Vitals:   10/06/20 1322  TempSrc: Temporal  PainSc: Asleep         Complications: No notable events documented.

## 2020-10-06 NOTE — Anesthesia Procedure Notes (Signed)
Date/Time: 10/06/2020 12:50 PM Performed by: Johnna Acosta, CRNA Pre-anesthesia Checklist: Patient identified, Emergency Drugs available, Suction available and Patient being monitored Patient Re-evaluated:Patient Re-evaluated prior to induction Oxygen Delivery Method: Nasal cannula Preoxygenation: Pre-oxygenation with 100% oxygen Induction Type: IV induction

## 2020-10-06 NOTE — H&P (Signed)
Jonathon Bellows, MD [address], [place], [place], [place], [address] [address], [place], [place], [place], [address] Phone: [phone]  Fax: [phone]  Primary Care Physician:  Lauree Chandler, NP   Pre-Procedure History & Physical: HPI:  Bonny Egger is a 75 y.o. male is here for an colonoscopy.   Past Medical History:  Diagnosis Date   Atrial fibrillation [facility])    Per [place] new patient packet   BPH (benign prostatic hyperplasia)    Elevated PSA    Enlarged prostate    High cholesterol    Hx of colonoscopy 2016   Per [place] new patient packet   Hypertension    Sleep apnea    Per [place] new patient packet   Slow urinary stream     Past Surgical History:  Procedure Laterality Date   COLONOSCOPY  07/03/2016   HERNIA REPAIR Right 2010   inguinal hernia    PROSTATE BIOPSY     TONSILLECTOMY      Prior to Admission medications   Medication Sig Start Date End Date Taking? Authorizing Provider  metoprolol succinate (TOPROL-XL) 25 MG 24 hr tablet TAKE ONE TABLET BY MOUTH EVERY DAY 06/21/20  Yes Lauree Chandler, NP  tamsulosin (FLOMAX) 0.4 MG CAPS capsule TAKE 1 CAPSULE BY MOUTH EVERY DAY 06/21/20  Yes Lauree Chandler, NP  Docusate Calcium (STOOL SOFTENER PO) Take by mouth daily. As needed    [provider]  metaxalone (SKELAXIN) 800 MG tablet Take 800 mg by mouth 3 (three) times daily as needed. For pain 06/04/18   [provider]  pravastatin (PRAVACHOL) 20 MG tablet TAKE ONE TABLET BY MOUTH EVERY DAY 06/30/20   Lauree Chandler, NP  PSYLLIUM FIBER PO Take by mouth daily. As needed    [provider]  XARELTO 20 MG TABS tablet TAKE ONE TABLET BY MOUTH EVERY DAY WITH EVENING MEAL 06/30/20   Lauree Chandler, NP    Allergies as of 09/22/2020   (No Known Allergies)    Family History  Problem Relation Age of Onset   Aneurysm Father    Heart attack Father    Heart disease Father     Social History   Socioeconomic History   Marital  status: Married    Spouse name: Not on file   Number of children: Not on file   Years of education: Not on file   Highest education level: Not on file  Occupational History   Not on file  Tobacco Use   Smoking status: Never   Smokeless tobacco: Never  Vaping Use   Vaping Use: Never used  Substance and Sexual Activity   Alcohol use: Yes    Alcohol/week: 14.0 standard drinks    Types: 14 Standard drinks or equivalent per week    Comment: burbon 3 drinks a night    Drug use: Never   Sexual activity: Not on file  Other Topics Concern   Not on file  Social History Narrative   Diet      Do you drink/eat things with caffeine Yes      Marital Status Married  What year were you married? 1968      Do you live in a house, apartment, assisted living, condo, trailer, etc.? Assisted living      Is it one or more stories? 1      How many persons live in your home? 2         Do you have any pets  in your home?(please list): No      Highest level of education completed: BA, [employer] 1967      Current or past profession: Civil engineer, contracting      Do you exercise?: Moderately  Type and how often: Wak, use wellnes equipment 2-3 times a week      Living Will?      DNR form? Yes    If not, do you wish to discuss one?       POA/HPOA forms? Yes      Difficulty bathing or dressing yourself? Patient did not answer these questions on new patient packet      Difficulty preparing food or eating?      Difficulty managing medications?      Difficulty managing your finances?      Difficulty affording your medications?                     Social Determinants of Health   Financial Resource Strain: Not on file  Food Insecurity: Not on file  Transportation Needs: Not on file  Physical Activity: Not on file  Stress: Not on file  Social Connections: Not on file  Intimate Partner Violence: Not on file    Review of Systems: See HPI, otherwise negative ROS  Physical Exam: BP (!) 158/64    Pulse 65   Temp 97.6 F (36.4 C) (Temporal)   Resp 18   Ht 5\' 7"  (1.702 m)   Wt 83.9 kg   SpO2 100%   BMI 28.98 kg/m  General:   Alert,  pleasant and cooperative in NAD Head:  Normocephalic and atraumatic. Neck:  Supple; no masses or thyromegaly. Lungs:  Clear throughout to auscultation, normal respiratory effort.    Heart:  +S1, +S2, Regular rate and rhythm, No edema. Abdomen:  Soft, nontender and nondistended. Normal bowel sounds, without guarding, and without rebound.   Neurologic:  Alert and  oriented x4;  grossly normal neurologically.  Impression/Plan: Samual Beals is here for an colonoscopy to be performed for surveillance due to prior history of colon polyps   Risks, benefits, limitations, and alternatives regarding  colonoscopy have been reviewed with the patient.  Questions have been answered.  All parties agreeable.   Jonathon Bellows, MD  10/06/2020, 12:50 PM

## 2020-10-07 ENCOUNTER — Encounter: Payer: Self-pay | Admitting: Gastroenterology

## 2020-10-07 LAB — SURGICAL PATHOLOGY

## 2020-10-15 ENCOUNTER — Encounter: Payer: Self-pay | Admitting: Nurse Practitioner

## 2020-10-18 ENCOUNTER — Encounter: Payer: Self-pay | Admitting: Gastroenterology

## 2020-12-09 DIAGNOSIS — I10 Essential (primary) hypertension: Secondary | ICD-10-CM | POA: Diagnosis not present

## 2020-12-09 DIAGNOSIS — I480 Paroxysmal atrial fibrillation: Secondary | ICD-10-CM | POA: Diagnosis not present

## 2020-12-10 ENCOUNTER — Encounter: Payer: Self-pay | Admitting: Nurse Practitioner

## 2020-12-10 LAB — HEPATIC FUNCTION PANEL
ALT: 18 (ref 10–40)
AST: 24 (ref 14–40)
Alkaline Phosphatase: 106 (ref 25–125)
Bilirubin, Total: 2.7

## 2020-12-10 LAB — BASIC METABOLIC PANEL
BUN: 17 (ref 4–21)
CO2: 27 — AB (ref 13–22)
Chloride: 103 (ref 99–108)
Creatinine: 1 (ref 0.6–1.3)
Glucose: 89
Potassium: 4 (ref 3.4–5.3)
Sodium: 141 (ref 137–147)

## 2020-12-10 LAB — CBC AND DIFFERENTIAL
HCT: 42 (ref 41–53)
Hemoglobin: 14 (ref 13.5–17.5)
Neutrophils Absolute: 4298
Platelets: 140 — AB (ref 150–399)
WBC: 7.5

## 2020-12-10 LAB — COMPREHENSIVE METABOLIC PANEL
Albumin: 4.1 (ref 3.5–5.0)
Calcium: 9.2 (ref 8.7–10.7)
Globulin: 2.4

## 2020-12-10 LAB — CBC: RBC: 4.13 (ref 3.87–5.11)

## 2020-12-10 NOTE — Telephone Encounter (Signed)
Received order to be signed from [facility], [place] [phone]  Fax: [phone] For CPAP Supplies.  Placed order in [place] folder to review and sign.

## 2020-12-15 ENCOUNTER — Telehealth: Payer: Self-pay

## 2020-12-15 NOTE — Telephone Encounter (Signed)
Lauree Chandler, NP  [name], CMA 26 minutes ago (2:25 PM)   Will you follow up with him on this since we do not have original sleep study, he Erma Raiche could bring this information to his appt tomorrow.

## 2020-12-15 NOTE — Telephone Encounter (Signed)
Called and spoke with patient regarding paperwork for CPAP machine. I informed patient that Sherrie Mustache, NP will discuss that with him on his visit that is scheduled for 12/16/2020. Information needed from sleep study. Patient states that sleep study was done over 12 years ago.

## 2020-12-15 NOTE — Telephone Encounter (Signed)
Patient stated that he will discuss this with Janett Billow at his appointment tomorrow. Stated that he does not have access to his last sleep study, stated it was done over 12 years ago.

## 2020-12-15 NOTE — Telephone Encounter (Signed)
Incoming call received form patient stating he just got off the phone with [facility] and the still have not received order from [facility] for his CPAP supplies.   I checked jessica's review and sign folder and form was not in there. Patient aware I will check with Rodena Piety and Janett Billow on the location and status of form.  Patient is requesting a call back before the end of the day with a status.

## 2020-12-16 ENCOUNTER — Encounter: Payer: Self-pay | Admitting: Nurse Practitioner

## 2020-12-16 ENCOUNTER — Other Ambulatory Visit: Payer: Self-pay

## 2020-12-16 ENCOUNTER — Ambulatory Visit: Payer: Medicare PPO | Admitting: Nurse Practitioner

## 2020-12-16 VITALS — BP 120/80 | HR 72 | Temp 98.5°F | Ht 67.0 in | Wt 184.0 lb

## 2020-12-16 DIAGNOSIS — I480 Paroxysmal atrial fibrillation: Secondary | ICD-10-CM

## 2020-12-16 DIAGNOSIS — G4733 Obstructive sleep apnea (adult) (pediatric): Secondary | ICD-10-CM | POA: Diagnosis not present

## 2020-12-16 DIAGNOSIS — R17 Unspecified jaundice: Secondary | ICD-10-CM

## 2020-12-16 DIAGNOSIS — I872 Venous insufficiency (chronic) (peripheral): Secondary | ICD-10-CM | POA: Diagnosis not present

## 2020-12-16 DIAGNOSIS — E782 Mixed hyperlipidemia: Secondary | ICD-10-CM

## 2020-12-16 DIAGNOSIS — N401 Enlarged prostate with lower urinary tract symptoms: Secondary | ICD-10-CM

## 2020-12-16 DIAGNOSIS — Z9989 Dependence on other enabling machines and devices: Secondary | ICD-10-CM

## 2020-12-16 DIAGNOSIS — R350 Frequency of micturition: Secondary | ICD-10-CM

## 2020-12-16 MED ORDER — TAMSULOSIN HCL 0.4 MG PO CAPS: 0.8000 mg | ORAL_CAPSULE | Freq: Every day | ORAL | 1 refills | Status: DC

## 2020-12-16 NOTE — Patient Instructions (Addendum)
-  encouraged to elevate legs above level of heart as tolerates, low sodium diet, compression hose as tolerates (on in am, off in pm)  Can increase flomax to 2 tablets at bedtime.

## 2020-12-16 NOTE — Progress Notes (Signed)
Careteam: Patient Care Team: Lauree Chandler, NP as PCP - General (Geriatric Medicine)  Advanced Directive information Does Patient Have a Medical Advance Directive?: Yes, Type of Advance Directive: [place];Living will;Out of facility DNR (pink MOST or yellow form), Pre-existing out of facility DNR order (yellow form or pink MOST form): Yellow form placed in chart (order not valid for inpatient use)  No Known Allergies  Chief Complaint  Patient presents with   Medical Management of Chronic Issues    6 month follow up Discuss CPAP order and possible need for repeat sleep study. Lower left leg swollen with lots of bumps. No pain or soreness. Feels tight. No drainage from bumps. Patient hit leg on hotel bed frame at hotel in September.     HPI: Patient is a 75 y.o. male seen in today at the [facility] for routine follow up.  OSA- uses CPAP has had for 20 years and has not needed new orders in the last 12 years.  Using 9 cm H20 ERP level 1  He is very compliant with usage.   Continues on pravachol with hyperlipidemia  Htn- controlled on toprol   Has lower leg swelling, no pain (but tight), itching.  Slightly warm not hot.  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  Taking flomax 0.4 mg States in the morning he has to go to the bathroom every 30 minutes when he first gets up and then it gets better.  He has urgency with incontinence if he does not go to the bathroom.   Elevated bilirubin- all other liver enzymes normal. Does drink 3 ETOH beverages in the evening. Reports he is trying to cut back on this.   Review of Systems:  Review of Systems  Constitutional:  Negative for chills, fever and weight loss.  HENT:  Negative for tinnitus.   Respiratory:  Negative for cough, sputum production and shortness of breath.   Cardiovascular:   Positive for leg swelling. Negative for chest pain and palpitations.  Gastrointestinal:  Negative for abdominal pain, constipation, diarrhea and heartburn.  Genitourinary:  Negative for dysuria, frequency and urgency.  Musculoskeletal:  Negative for back pain, falls, joint pain and myalgias.  Skin: Negative.   Neurological:  Negative for dizziness and headaches.  Psychiatric/Behavioral:  Negative for depression and memory loss. The patient does not have insomnia.    Past Medical History:  Diagnosis Date   Atrial fibrillation [facility])    Per [place] new patient packet   BPH (benign prostatic hyperplasia)    Elevated PSA    Enlarged prostate    High cholesterol    Hx of colonoscopy 2016   Per [place] new patient packet   Hypertension    Sleep apnea    Per [place] new patient packet   Slow urinary stream    Past Surgical History:  Procedure Laterality Date   COLONOSCOPY  07/03/2016   COLONOSCOPY WITH PROPOFOL N/A 10/06/2020   Procedure: COLONOSCOPY WITH PROPOFOL;  Surgeon: Jonathon Bellows, MD;  Location: [facility] ENDOSCOPY;  Service: Gastroenterology;  Laterality: N/A;   HERNIA REPAIR Right 2010   inguinal hernia    PROSTATE BIOPSY     TONSILLECTOMY     Social History:   reports that he has never smoked. He has never used smokeless tobacco. He reports current alcohol use of about  14.0 standard drinks per week. He reports that he does not use drugs.  Family History  Problem Relation Age of Onset   Aneurysm Father    Heart attack Father    Heart disease Father     Medications: Patient's Medications  New Prescriptions   No medications on file  Previous Medications   DOCUSATE CALCIUM (STOOL SOFTENER PO)    Take by mouth daily. As needed   METOPROLOL SUCCINATE (TOPROL-XL) 25 MG 24 HR TABLET    TAKE ONE TABLET BY MOUTH EVERY DAY   PRAVASTATIN (PRAVACHOL) 20 MG TABLET    TAKE ONE TABLET BY MOUTH EVERY DAY   PSYLLIUM FIBER PO    Take by mouth daily. As needed   XARELTO 20 MG TABS TABLET    TAKE ONE  TABLET BY MOUTH EVERY DAY WITH EVENING MEAL  Modified Medications   Modified Medication Previous Medication   TAMSULOSIN (FLOMAX) 0.4 MG CAPS CAPSULE tamsulosin (FLOMAX) 0.4 MG CAPS capsule      Take 2 capsules (0.8 mg total) by mouth daily.    TAKE 1 CAPSULE BY MOUTH EVERY DAY  Discontinued Medications   METAXALONE (SKELAXIN) 800 MG TABLET    Take 800 mg by mouth 3 (three) times daily as needed. For pain    Physical Exam:  Vitals:   12/16/20 1113  BP: 120/80  Pulse: 72  Temp: 98.5 F (36.9 C)  TempSrc: Oral  SpO2: 96%  Weight: 184 lb (83.5 kg)  Height: 5\' 7"  (1.702 m)   Body mass index is 28.82 kg/m. Wt Readings from Last 3 Encounters:  12/16/20 184 lb (83.5 kg)  10/06/20 185 lb (83.9 kg)  06/22/20 193 lb (87.5 kg)    Physical Exam Constitutional:      General: He is not in acute distress.    Appearance: He is well-developed. He is not diaphoretic.  HENT:     Head: Normocephalic and atraumatic.     Right Ear: External ear normal.     Left Ear: External ear normal.     Mouth/Throat:     Pharynx: No oropharyngeal exudate.  Eyes:     Conjunctiva/sclera: Conjunctivae normal.     Pupils: Pupils are equal, round, and reactive to light.  Cardiovascular:     Rate and Rhythm: Normal rate and regular rhythm.     Heart sounds: Normal heart sounds.  Pulmonary:     Effort: Pulmonary effort is normal.     Breath sounds: Normal breath sounds.  Abdominal:     General: Bowel sounds are normal.     Palpations: Abdomen is soft.  Musculoskeletal:        General: No tenderness.     Cervical back: Normal range of motion and neck supple.     Right lower leg: Edema (1+) present.     Left lower leg: Edema (1+) present.  Skin:    General: Skin is warm and dry.  Neurological:     Mental Status: He is alert and oriented to person, place, and time.    Labs reviewed: Basic Metabolic Panel: Recent Labs    12/18/19 0000 06/17/20 0000 12/10/20 0000  NA 139 138 141  K 4.4 4.2  4.0  CL 102 104 103  CO2 28* 29* 27*  BUN 22* 18 17  CREATININE 0.9 1.0 1.0  CALCIUM 9.5 9.4 9.2   Liver Function Tests: Recent Labs    12/18/19 0000 06/17/20 0000 12/10/20 0000  AST 27 26 24   ALT 29 26 18  ALKPHOS 58 50 106  ALBUMIN 4.3 4.2 4.1   No results for input(s): LIPASE, AMYLASE in the last 8760 hours. No results for input(s): AMMONIA in the last 8760 hours. CBC: Recent Labs    12/18/19 0000 06/17/20 0000 12/10/20 0000  WBC 7.2 7.3 7.5  NEUTROABS  --  4,249.00 4,298.00  HGB 14.5 14.1 14.0  HCT 42 41 42  PLT 195 169 140*   Lipid Panel: Recent Labs    06/17/20 0000  CHOL 149  HDL 73*  LDLCALC 63  TRIG 53   TSH: No results for input(s): TSH in the last 8760 hours. A1C: No results found for: HGBA1C   Assessment/Plan 1. Benign prostatic hyperplasia with urinary frequency -ongoing urinary symptoms. Does not wish to have surgery to correct due to being caregiver for wife. Educated that increasing flomax may have side effects and to use caution/monitor when increasing for dizziness, lightheadedness.  - tamsulosin (FLOMAX) 0.4 MG CAPS capsule; Take 2 capsules (0.8 mg total) by mouth daily.  Dispense: 180 capsule; Refill: 1  2. Serum total bilirubin elevated -he has been working on decreasing alcohol intake. Will get [place] to further evaluate due to trending up in bilirubin. Other liver enzymes have been WNL - US Abdomen Limited RUQ (LIVER/GB); Future  3. Edema of both lower extremities due to peripheral venous insufficiency -ongoing, encouraged to elevate legs above level of heart as tolerates, low sodium diet, compression hose as tolerates (on in am, off in pm)  4. OSA on CPAP -paperwork completed and sign, will fax to company so he can get more CPAP supplies   5. Paroxysmal atrial fibrillation (HCC) Continues on xarelto for anticoaguation and metoprolol for rate control.   6. Mixed hyperlipidemia -continues on pravastatin, continue dietary modification  as well.    Next appt: 6 months, lab prior  Zan Triska K. [place], [facility] [phone]

## 2020-12-16 NOTE — Telephone Encounter (Signed)
Paperwork completed and given back to Evie to fax

## 2020-12-19 ENCOUNTER — Other Ambulatory Visit: Payer: Self-pay | Admitting: Nurse Practitioner

## 2020-12-28 ENCOUNTER — Other Ambulatory Visit: Payer: Self-pay | Admitting: Nurse Practitioner

## 2021-01-01 ENCOUNTER — Encounter: Payer: Self-pay | Admitting: Emergency Medicine

## 2021-01-01 ENCOUNTER — Other Ambulatory Visit: Payer: Self-pay

## 2021-01-01 ENCOUNTER — Ambulatory Visit
Admission: EM | Admit: 2021-01-01 | Discharge: 2021-01-01 | Disposition: A | Discharge status: Home / Self Care | Payer: Medicare PPO | Attending: Emergency Medicine | Admitting: Emergency Medicine

## 2021-01-01 DIAGNOSIS — R310 Gross hematuria: Secondary | ICD-10-CM | POA: Diagnosis not present

## 2021-01-01 LAB — POCT URINALYSIS DIP (MANUAL ENTRY)
Glucose, UA: NEGATIVE mg/dL
Leukocytes, UA: NEGATIVE
Nitrite, UA: NEGATIVE
Protein Ur, POC: 100 mg/dL — AB
Spec Grav, UA: 1.02 (ref 1.010–1.025)
Urobilinogen, UA: 1 E.U./dL
pH, UA: 6 (ref 5.0–8.0)

## 2021-01-01 MED ORDER — CEPHALEXIN 500 MG PO CAPS: 500.0000 mg | ORAL_CAPSULE | Freq: Two times a day (BID) | ORAL | 0 refills | Status: AC

## 2021-01-01 NOTE — ED Provider Notes (Signed)
Randy Montgomery    CSN: [id number] Arrival date & time: 01/01/21  0840      History   Chief Complaint Chief Complaint  Patient presents with   Hematuria    HPI Randy Montgomery is a 75 y.o. male.  Patient presents with gross hematuria since last night.  He has had episodes of hematuria in the past and is followed by urology.  He denies fever, chills, dysuria, abdominal pain, flank pain, or other symptoms.  His medical history includes BPH, elevated PSA, slow urinary stream, hypertension, atrial fibrillation.  Patient is on Xarelto.  He denies other bleeding.    The history is provided by the patient and medical records.   Past Medical History:  Diagnosis Date   Atrial fibrillation [facility])    Per [place] new patient packet   BPH (benign prostatic hyperplasia)    Elevated PSA    Enlarged prostate    High cholesterol    Hx of colonoscopy 2016   Per [place] new patient packet   Hypertension    Sleep apnea    Per [place] new patient packet   Slow urinary stream     Patient Active Problem List   Diagnosis Date Noted   Unilateral recurrent inguinal hernia without obstruction or gangrene 06/20/2019   Chronic bilateral low back pain without sciatica 06/04/2018   Elevated blood pressure reading 11/23/2017   OSA (obstructive sleep apnea) 05/29/2017   Benign non-nodular prostatic hyperplasia with lower urinary tract symptoms 04/20/2015   Chronic atrial fibrillation ([place]) 05/02/2012   Laryngospasm 01/30/2012   Benign neoplasm of colon 03/07/2006   History of colonic polyps 03/07/2006   Elevated prostate specific antigen (PSA) 03/13/2003    Past Surgical History:  Procedure Laterality Date   COLONOSCOPY  07/03/2016   COLONOSCOPY WITH PROPOFOL N/A 10/06/2020   Procedure: COLONOSCOPY WITH PROPOFOL;  Surgeon: Jonathon Bellows, MD;  Location[facility] ENDOSCOPY;  Service: Gastroenterology;  Laterality: N/A;   HERNIA REPAIR Right 2010   inguinal hernia    PROSTATE BIOPSY     TONSILLECTOMY          Home Medications    Prior to Admission medications   Medication Sig Start Date End Date Taking? Authorizing Provider  cephALEXin (KEFLEX) 500 MG capsule Take 1 capsule (500 mg total) by mouth 2 (two) times daily for 5 days. 01/01/21 01/06/21 Yes Sharion Balloon, NP  metoprolol succinate (TOPROL-XL) 25 MG 24 hr tablet TAKE ONE TABLET BY MOUTH EVERY DAY 12/20/20   Lauree Chandler, NP  Docusate Calcium (STOOL SOFTENER PO) Take by mouth daily. As needed    [provider]  pravastatin (PRAVACHOL) 20 MG tablet TAKE ONE TABLET BY MOUTH EVERY DAY 12/28/20   Lauree Chandler, NP  PSYLLIUM FIBER PO Take by mouth daily. As needed    [provider]  tamsulosin (FLOMAX) 0.4 MG CAPS capsule Take 2 capsules (0.8 mg total) by mouth daily. 12/16/20   Lauree Chandler, NP  XARELTO 20 MG TABS tablet TAKE ONE TABLET BY MOUTH EVERY DAY WITH EVENING MEAL 12/28/20   Lauree Chandler, NP    Family History Family History  Problem Relation Age of Onset   Aneurysm Father    Heart attack Father    Heart disease Father     Social History Social History   Tobacco Use   Smoking status: Never   Smokeless tobacco: Never  Vaping Use   Vaping Use: Never used  Substance Use Topics   Alcohol use:  Yes    Alcohol/week: 14.0 standard drinks    Types: 14 Standard drinks or equivalent per week    Comment: burbon 3 drinks a night    Drug use: Never     Allergies   Patient has no known allergies.   Review of Systems Review of Systems  Constitutional:  Negative for chills and fever.  Respiratory:  Negative for cough and shortness of breath.   Cardiovascular:  Negative for chest pain and palpitations.  Gastrointestinal:  Negative for abdominal pain and vomiting.  Genitourinary:  Positive for hematuria. Negative for dysuria and flank pain.  Skin:  Negative for color change and rash.  All other systems reviewed and are negative.   Physical Exam Triage Vital Signs ED Triage  Vitals  Enc Vitals Group     BP 01/01/21 0934 (!) 142/70     Pulse Rate 01/01/21 0934 89     Resp 01/01/21 0934 18     Temp 01/01/21 0934 99.1 F (37.3 C)     Temp src --      SpO2 01/01/21 0934 98 %     Weight --      Height --      Head Circumference --      [place] Flow --      Pain Score 01/01/21 0937 0     Pain Loc --      Pain Edu? --      Excl. in [place]? --    No data found.  Updated Vital Signs BP (!) 142/70    Pulse 89    Temp 99.1 F (37.3 C)    Resp 18    SpO2 98%   Visual Acuity Right Eye Distance:   Left Eye Distance:   Bilateral Distance:    Right Eye Near:   Left Eye Near:    Bilateral Near:     Physical Exam Vitals and nursing note reviewed.  Constitutional:      General: He is not in acute distress.    Appearance: He is well-developed. He is not ill-appearing.  HENT:     Mouth/Throat:     Mouth: Mucous membranes are moist.  Cardiovascular:     Rate and Rhythm: Normal rate and regular rhythm.     Heart sounds: Normal heart sounds.  Pulmonary:     Effort: Pulmonary effort is normal. No respiratory distress.     Breath sounds: Normal breath sounds.  Abdominal:     Palpations: Abdomen is soft.     Tenderness: There is no abdominal tenderness. There is no right CVA tenderness, left CVA tenderness, guarding or rebound.  Musculoskeletal:     Cervical back: Neck supple.  Skin:    General: Skin is warm and dry.  Neurological:     Mental Status: He is alert.  Psychiatric:        Mood and Affect: Mood normal.        Behavior: Behavior normal.     UC Treatments / Results  Labs (all labs ordered are listed, but only abnormal results are displayed) Labs Reviewed  POCT URINALYSIS DIP (MANUAL ENTRY) - Abnormal; Notable for the following components:      Result Value   Color, UA red (*)    Clarity, UA cloudy (*)    Bilirubin, UA small (*)    Ketones, POC UA trace (5) (*)    Blood, UA large (*)    Protein Ur, POC =100 (*)    All other components  within normal limits  URINE CULTURE    EKG   Radiology No results found.  Procedures Procedures (including critical care time)  Medications Ordered in UC Medications - No data to display  Initial Impression / Assessment and Plan / UC Course  I have reviewed the triage vital signs and the nursing notes.  Pertinent labs & imaging results that were available during my care of the patient were reviewed by me and considered in my medical decision making (see chart for details).   Hematuria.  Treating with Keflex. Urine culture pending. Discussed with patient that we will call if the urine culture shows the need to change or discontinue the antibiotic. Instructed him to follow-up with his urologist or PCP if his symptoms are not improving. Patient agrees to plan of care.      Final Clinical Impressions(s) / UC Diagnoses   Final diagnoses:  Gross hematuria     Discharge Instructions      Take the antibiotic as directed.  The urine culture is pending.  We will call you if it shows the need to change or discontinue your antibiotic.    Follow up with your urologist or primary care provider if your symptoms are not improving.        ED Prescriptions     Medication Sig Dispense Auth. Provider   cephALEXin (KEFLEX) 500 MG capsule Take 1 capsule (500 mg total) by mouth 2 (two) times daily for 5 days. 10 capsule Sharion Balloon, NP      PDMP not reviewed this encounter.   Sharion Balloon, NP 01/01/21 1011

## 2021-01-01 NOTE — Discharge Instructions (Addendum)
Take the antibiotic as directed.  The urine culture is pending.  We will call you if it shows the need to change or discontinue your antibiotic.    Follow up with your urologist or primary care provider if your symptoms are not improving.

## 2021-01-01 NOTE — ED Triage Notes (Addendum)
Pt here with hematuria since last night. Pt finds that he is leaking blood even when he is not urinating. States this has happened multiple times before and Cipro is the tx that works. His urologist is retired and he is attempting to reestablish care. Denies pain, but does have some pressure. Pt is on Xarelto with last dose last night.

## 2021-01-11 DIAGNOSIS — R600 Localized edema: Secondary | ICD-10-CM | POA: Diagnosis not present

## 2021-01-11 DIAGNOSIS — L608 Other nail disorders: Secondary | ICD-10-CM | POA: Diagnosis not present

## 2021-01-11 DIAGNOSIS — D485 Neoplasm of uncertain behavior of skin: Secondary | ICD-10-CM | POA: Diagnosis not present

## 2021-01-11 DIAGNOSIS — S90121A Contusion of right lesser toe(s) without damage to nail, initial encounter: Secondary | ICD-10-CM | POA: Diagnosis not present

## 2021-01-11 DIAGNOSIS — I872 Venous insufficiency (chronic) (peripheral): Secondary | ICD-10-CM | POA: Diagnosis not present

## 2021-01-26 ENCOUNTER — Ambulatory Visit: Payer: Medicare PPO

## 2021-01-26 ENCOUNTER — Ambulatory Visit
Admission: RE | Admit: 2021-01-26 | Discharge: 2021-01-26 | Disposition: A | Discharge status: Home / Self Care | Payer: Medicare PPO | Source: Ambulatory Visit | Attending: Nurse Practitioner | Admitting: Nurse Practitioner

## 2021-01-26 ENCOUNTER — Other Ambulatory Visit: Payer: Self-pay

## 2021-01-26 DIAGNOSIS — R17 Unspecified jaundice: Secondary | ICD-10-CM | POA: Diagnosis not present

## 2021-01-26 DIAGNOSIS — K760 Fatty (change of) liver, not elsewhere classified: Secondary | ICD-10-CM | POA: Diagnosis not present

## 2021-01-26 DIAGNOSIS — R188 Other ascites: Secondary | ICD-10-CM | POA: Diagnosis not present

## 2021-02-01 ENCOUNTER — Encounter: Payer: Self-pay | Admitting: Nurse Practitioner

## 2021-02-16 DIAGNOSIS — H2513 Age-related nuclear cataract, bilateral: Secondary | ICD-10-CM | POA: Diagnosis not present

## 2021-02-28 DIAGNOSIS — R600 Localized edema: Secondary | ICD-10-CM | POA: Diagnosis not present

## 2021-02-28 DIAGNOSIS — I872 Venous insufficiency (chronic) (peripheral): Secondary | ICD-10-CM | POA: Diagnosis not present

## 2021-03-14 ENCOUNTER — Other Ambulatory Visit: Payer: Self-pay | Admitting: Nurse Practitioner

## 2021-03-14 DIAGNOSIS — N401 Enlarged prostate with lower urinary tract symptoms: Secondary | ICD-10-CM

## 2021-03-22 ENCOUNTER — Encounter: Payer: Self-pay | Admitting: Nurse Practitioner

## 2021-03-22 ENCOUNTER — Telehealth: Payer: Self-pay

## 2021-03-22 MED ORDER — METAXALONE 800 MG PO TABS: 800.0000 mg | ORAL_TABLET | Freq: Three times a day (TID) | ORAL | 1 refills | Status: DC | PRN

## 2021-03-22 NOTE — Telephone Encounter (Signed)
Incoming fax received from Randy Montgomery to initiate prior authorization for Metaxalone 800 mg. ? ?PA was initiated through CoverMyMed. I called Lauree Chandler, NP to confirm diagnosis. Per Janett Billow rx provided for chronic back pain (see problem list). ? ?Key: [id number] ? ?Awaiting for reply from insurance company which can take 48-72 hours  ? ? ? ? ?

## 2021-03-23 MED ORDER — TIZANIDINE HCL 2 MG PO CAPS: 2.0000 mg | ORAL_CAPSULE | Freq: Three times a day (TID) | ORAL | 1 refills | Status: AC | PRN

## 2021-03-23 NOTE — Telephone Encounter (Signed)
Received fax from [employer] [facility] stating that Metaxalone 839m was DENIED. Stated they cover this drug when criteria is met. The unmet criteris are: has tried within the past 12 months tizanidine tablet AND Baclofen tablet.  ? ?Forwarded Fax to [employer] advise.  ?

## 2021-03-23 NOTE — Telephone Encounter (Signed)
Please call pt and notify, ask if he has tried either medication and if not if he is agreeable.  ?Thank you ?

## 2021-03-23 NOTE — Telephone Encounter (Signed)
New prescription sent for tizanidine sent to pharmacy.  ?

## 2021-03-23 NOTE — Telephone Encounter (Signed)
Patient notified.  ?Patient has NOT tried Tizanidine nor Baclofen. Stated that if you want to send one to his pharmacy to try he will.  ? ?Please Advise.  ?

## 2021-03-24 NOTE — Telephone Encounter (Signed)
Call returned to patient and/or family member, a detailed message was left with the providers reply.   

## 2021-05-03 ENCOUNTER — Encounter: Payer: Self-pay | Admitting: Nurse Practitioner

## 2021-05-03 NOTE — Telephone Encounter (Signed)
Forwarded to [employer].  ?I did not see a referral placed for ENT Referral.  ?

## 2021-05-12 ENCOUNTER — Ambulatory Visit: Payer: Medicare PPO | Admitting: Urology

## 2021-05-26 ENCOUNTER — Encounter: Payer: Self-pay | Admitting: Nurse Practitioner

## 2021-06-12 ENCOUNTER — Encounter: Payer: Self-pay | Admitting: Nurse Practitioner

## 2021-06-13 ENCOUNTER — Other Ambulatory Visit: Payer: Self-pay | Admitting: Nurse Practitioner

## 2021-06-13 DIAGNOSIS — I480 Paroxysmal atrial fibrillation: Secondary | ICD-10-CM | POA: Diagnosis not present

## 2021-06-13 DIAGNOSIS — N401 Enlarged prostate with lower urinary tract symptoms: Secondary | ICD-10-CM

## 2021-06-13 DIAGNOSIS — E782 Mixed hyperlipidemia: Secondary | ICD-10-CM | POA: Diagnosis not present

## 2021-06-13 LAB — COMPREHENSIVE METABOLIC PANEL
Albumin: 4.3 (ref 3.5–5.0)
Calcium: 9.6 (ref 8.7–10.7)
Globulin: 2.4
eGFR: 84

## 2021-06-13 LAB — BASIC METABOLIC PANEL
BUN: 17 (ref 4–21)
CO2: 30 — AB (ref 13–22)
Chloride: 102 (ref 99–108)
Creatinine: 0.9 (ref 0.6–1.3)
Glucose: 85
Potassium: 4.2 mEq/L (ref 3.5–5.1)
Sodium: 138 (ref 137–147)

## 2021-06-13 LAB — HEPATIC FUNCTION PANEL
ALT: 22 U/L (ref 10–40)
AST: 24 (ref 14–40)
Alkaline Phosphatase: 131 — AB (ref 25–125)
Bilirubin, Total: 3.5

## 2021-06-13 LAB — CBC AND DIFFERENTIAL
HCT: 40 — AB (ref 41–53)
Hemoglobin: 13.9 (ref 13.5–17.5)
Neutrophils Absolute: 3475
Platelets: 151 10*3/uL (ref 150–400)
WBC: 5.9

## 2021-06-13 LAB — CBC: RBC: 4.13 (ref 3.87–5.11)

## 2021-06-14 LAB — LIPID PANEL
Cholesterol: 149 (ref 0–200)
HDL: 79 — AB (ref 35–70)
LDL Cholesterol: 57
Triglycerides: 53 (ref 40–160)

## 2021-06-16 ENCOUNTER — Ambulatory Visit: Payer: Medicare PPO | Admitting: Nurse Practitioner

## 2021-06-16 ENCOUNTER — Encounter: Payer: Self-pay | Admitting: Nurse Practitioner

## 2021-06-16 VITALS — BP 140/80 | HR 54 | Temp 98.5°F | Resp 18 | Wt 173.0 lb

## 2021-06-16 DIAGNOSIS — I872 Venous insufficiency (chronic) (peripheral): Secondary | ICD-10-CM

## 2021-06-16 DIAGNOSIS — R17 Unspecified jaundice: Secondary | ICD-10-CM | POA: Diagnosis not present

## 2021-06-16 DIAGNOSIS — N401 Enlarged prostate with lower urinary tract symptoms: Secondary | ICD-10-CM | POA: Diagnosis not present

## 2021-06-16 DIAGNOSIS — I480 Paroxysmal atrial fibrillation: Secondary | ICD-10-CM | POA: Diagnosis not present

## 2021-06-16 DIAGNOSIS — D6869 Other thrombophilia: Secondary | ICD-10-CM

## 2021-06-16 DIAGNOSIS — E782 Mixed hyperlipidemia: Secondary | ICD-10-CM

## 2021-06-16 DIAGNOSIS — R350 Frequency of micturition: Secondary | ICD-10-CM

## 2021-06-16 NOTE — Progress Notes (Unsigned)
Careteam: Patient Care Team: Lauree Chandler, NP as PCP - General (Geriatric Medicine)  Advanced Directive information Does Patient Have a Medical Advance Directive?: Yes, Type of Advance Directive: [place];Living will;Out of facility DNR (pink MOST or yellow form), Does patient want to make changes to medical advance directive?: No - Patient declined  No Known Allergies  Chief Complaint  Patient presents with   Medical Management of Chronic Issues    6 month follow up.   Immunizations    Discuss the need for Tetanus vaccine.     HPI: Patient is a 76 y.o. male seen in today at the [facility] for routine follow up.   Did not end up going to urologist. He increase flomax to 2 tablets at night and  symptoms have improved. 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   He has cut back on his alcohol use.  He is doing all the cooking so will have drink after he is done. Time to time he will not drink at all.   A fib- rate controlled on metoprolol and continues on xarelto. No signs of bleeding. No palpitations.  No significant LE edema, shortness of breath, chest pains.    Review of Systems:  Review of Systems  Constitutional:  Negative for chills, fever and weight loss.  HENT:  Negative for tinnitus.   Respiratory:  Negative for cough, sputum production and shortness of breath.   Cardiovascular:  Negative for chest pain, palpitations and leg swelling.  Gastrointestinal:  Negative for abdominal pain, constipation, diarrhea and heartburn.  Genitourinary:  Negative for dysuria, frequency and urgency.  Musculoskeletal:  Negative for back pain, falls, joint pain and myalgias.  Skin: Negative.   Neurological:  Negative for dizziness and headaches.  Psychiatric/Behavioral:  Negative for depression and memory loss. The patient does not have insomnia.     Past Medical History:  Diagnosis Date   Atrial fibrillation [facility])    Per  [place] new patient packet   BPH (benign prostatic hyperplasia)    Elevated PSA    Enlarged prostate    High cholesterol    Hx of colonoscopy 2016   Per [place] new patient packet   Hypertension    Sleep apnea    Per [place] new patient packet   Slow urinary stream    Past Surgical History:  Procedure Laterality Date   COLONOSCOPY  07/03/2016   COLONOSCOPY WITH PROPOFOL N/A 10/06/2020   Procedure: COLONOSCOPY WITH PROPOFOL;  Surgeon: Jonathon Bellows, MD;  Location: [facility] ENDOSCOPY;  Service: Gastroenterology;  Laterality: N/A;   HERNIA REPAIR Right 2010   inguinal hernia    PROSTATE BIOPSY     TONSILLECTOMY     Social History:   reports that he has never smoked. He has never used smokeless tobacco. He reports current alcohol use of about 7.0 standard drinks of alcohol per week. He reports that he does not use drugs.  Family History  Problem Relation Age of Onset   Aneurysm Father    Heart attack Father    Heart disease Father     Medications: Patient's Medications  New Prescriptions   No medications on file  Previous Medications   DOCUSATE CALCIUM (STOOL SOFTENER PO)    Take by mouth daily. As needed   METOPROLOL SUCCINATE (TOPROL-XL) 25 MG 24 HR TABLET    TAKE ONE TABLET BY MOUTH EVERY DAY   PRAVASTATIN (PRAVACHOL) 20 MG TABLET  TAKE ONE TABLET BY MOUTH EVERY DAY   PSYLLIUM FIBER PO    Take by mouth daily. As needed   TAMSULOSIN (FLOMAX) 0.4 MG CAPS CAPSULE    TAKE 1 CAPSULE BY MOUTH EVERY DAY   TIZANIDINE (ZANAFLEX) 2 MG CAPSULE    Take 1 capsule (2 mg total) by mouth 3 (three) times daily as needed for muscle spasms.   XARELTO 20 MG TABS TABLET    TAKE ONE TABLET BY MOUTH EVERY DAY WITH EVENING MEAL  Modified Medications   No medications on file  Discontinued Medications   No medications on file    Physical Exam:  Vitals:   06/16/21 1034  BP: 140/80  Pulse: (!) 54  Resp: 18  Temp: 98.5 F (36.9 C)  SpO2: 97%  Weight: 173 lb (78.5 kg)   Body mass index is 27.1  kg/m. Wt Readings from Last 3 Encounters:  06/16/21 173 lb (78.5 kg)  12/16/20 184 lb (83.5 kg)  10/06/20 185 lb (83.9 kg)    Physical Exam Constitutional:      General: He is not in acute distress.    Appearance: He is well-developed. He is not diaphoretic.  HENT:     Head: Normocephalic and atraumatic.     Right Ear: External ear normal.     Left Ear: External ear normal.     Mouth/Throat:     Pharynx: No oropharyngeal exudate.  Eyes:     Conjunctiva/sclera: Conjunctivae normal.     Pupils: Pupils are equal, round, and reactive to light.  Cardiovascular:     Rate and Rhythm: Normal rate and regular rhythm.     Heart sounds: Normal heart sounds.  Pulmonary:     Effort: Pulmonary effort is normal.     Breath sounds: Normal breath sounds.  Abdominal:     General: Bowel sounds are normal. There is no distension.     Palpations: Abdomen is soft.     Tenderness: There is no abdominal tenderness.  Musculoskeletal:        General: No tenderness.     Cervical back: Normal range of motion and neck supple.     Right lower leg: No edema.     Left lower leg: No edema.  Skin:    General: Skin is warm and dry.  Neurological:     Mental Status: He is alert and oriented to person, place, and time.     Labs reviewed: Basic Metabolic Panel: Recent Labs    06/17/20 0000 12/10/20 0000  NA 138 141  K 4.2 4.0  CL 104 103  CO2 29* 27*  BUN 18 17  CREATININE 1.0 1.0  CALCIUM 9.4 9.2   Liver Function Tests: Recent Labs    06/17/20 0000 12/10/20 0000  AST 26 24  ALT 26 18  ALKPHOS 50 106  ALBUMIN 4.2 4.1   No results for input(s): "LIPASE", "AMYLASE" in the last 8760 hours. No results for input(s): "AMMONIA" in the last 8760 hours. CBC: Recent Labs    06/17/20 0000 12/10/20 0000  WBC 7.3 7.5  NEUTROABS 4,249.00 4,298.00  HGB 14.1 14.0  HCT 41 42  PLT 169 140*   Lipid Panel: Recent Labs    06/17/20 0000  CHOL 149  HDL 73*  LDLCALC 63  TRIG 53   TSH: No  results for input(s): "TSH" in the last 8760 hours. A1C: No results found for: "HGBA1C"   Assessment/Plan 1. Serum total bilirubin elevated -he has made dietary changes and decreased  alcohol yet bilirubin has increased, other enzymes in normal range. He will follow up with GI at this time. Pt aware to call for an appt.  Previously has seen Dr Jonathon Bellows. Consulted with him and will follow up INR, hepatitis panel, fractionated bilirubin and LFTs prior to follow up.    2. Benign prostatic hyperplasia with urinary frequency -improvement in BPH with increase in flomax to 2 capsules daily qhs   3. Edema of both lower extremities due to peripheral venous insufficiency -stable, continue with dietary modifications and compression socks.  4. Paroxysmal atrial fibrillation (HCC) -rate controlled, continues on metoprolol   5. Mixed hyperlipidemia -LDL stable on pravastatin  6. Acquired thrombophilia ([place]) Due to afib, continues on xarelto.    Carlos American. [place], [facility] [phone]

## 2021-06-21 ENCOUNTER — Telehealth: Payer: Self-pay | Admitting: Gastroenterology

## 2021-06-21 ENCOUNTER — Encounter: Payer: Self-pay | Admitting: Nurse Practitioner

## 2021-06-21 LAB — COMPREHENSIVE METABOLIC PANEL
Albumin: 4.3 (ref 3.5–5.0)
Globulin: 2.4

## 2021-06-21 LAB — HEPATIC FUNCTION PANEL
ALT: 20 U/L (ref 10–40)
AST: 22 (ref 14–40)
Alkaline Phosphatase: 110 (ref 25–125)
Bilirubin, Total: 2.5

## 2021-06-21 LAB — PROTIME-INR: Protime: 12.2 (ref 10.0–13.8)

## 2021-06-21 LAB — HM HEPATITIS C SCREENING LAB: HM Hepatitis Screen: NEGATIVE

## 2021-06-21 LAB — POCT INR: INR: 1.2 (ref 0.80–1.20)

## 2021-06-21 NOTE — Telephone Encounter (Signed)
Patient left vm to schedule an appt. Called patient back, no answer. Left vm for patient.

## 2021-06-22 ENCOUNTER — Encounter: Payer: Self-pay | Admitting: Nurse Practitioner

## 2021-06-23 NOTE — Telephone Encounter (Signed)
Message forwarded to Lauree Chandler, NP

## 2021-06-27 ENCOUNTER — Other Ambulatory Visit: Payer: Self-pay | Admitting: Nurse Practitioner

## 2021-08-01 ENCOUNTER — Other Ambulatory Visit: Payer: Self-pay | Admitting: Nurse Practitioner

## 2021-08-01 DIAGNOSIS — N401 Enlarged prostate with lower urinary tract symptoms: Secondary | ICD-10-CM

## 2021-08-02 ENCOUNTER — Other Ambulatory Visit: Payer: Self-pay

## 2021-08-02 DIAGNOSIS — N401 Enlarged prostate with lower urinary tract symptoms: Secondary | ICD-10-CM

## 2021-08-02 MED ORDER — TAMSULOSIN HCL 0.4 MG PO CAPS: 0.4000 mg | ORAL_CAPSULE | Freq: Every day | ORAL | 2 refills | Status: DC

## 2021-08-09 ENCOUNTER — Ambulatory Visit: Payer: Medicare PPO | Admitting: Nurse Practitioner

## 2021-08-09 ENCOUNTER — Encounter: Payer: Self-pay | Admitting: Nurse Practitioner

## 2021-08-09 VITALS — BP 146/80 | HR 70 | Temp 98.4°F | Ht 67.0 in | Wt 189.0 lb

## 2021-08-09 DIAGNOSIS — R600 Localized edema: Secondary | ICD-10-CM

## 2021-08-09 DIAGNOSIS — I480 Paroxysmal atrial fibrillation: Secondary | ICD-10-CM | POA: Diagnosis not present

## 2021-08-09 DIAGNOSIS — Z66 Do not resuscitate: Secondary | ICD-10-CM

## 2021-08-09 MED ORDER — FUROSEMIDE 20 MG PO TABS: 20.0000 mg | ORAL_TABLET | Freq: Every day | ORAL | 3 refills | Status: DC

## 2021-08-09 NOTE — Progress Notes (Signed)
Careteam: Patient Care Team: Lauree Chandler, NP as PCP - General (Geriatric Medicine)  Advanced Directive information Does Patient Have a Medical Advance Directive?: Yes, Type of Advance Directive: [place];Living will;Out of facility DNR (pink MOST or yellow form), Does patient want to make changes to medical advance directive?: No - Patient declined  No Known Allergies  Chief Complaint  Patient presents with   Acute Visit    Knee swelling and bloating      HPI: Patient is a 76 y.o. male seen in today at the [facility] for to discussed bilateral knee swelling.  Reports he is not sure if he should have been seen about this.   Reports he is feeling bloated and wonders if this is due to his CPAP and its blowing into his stomach.  He is having more flatulence and burping and after he does this it is better.  No pain in the knees. Soemtimes it hard to change pants/shorts late in the day as the swelling worsens. He also limits salt  Has cut out all alcohol   Attempts to eat well.   Reports he has had shortness of breath with movement for a while or bending over to put his shoes on. No shortness of breath with laying flat.  No chest pains or palpitation   Review of Systems:  Review of Systems  Constitutional:  Negative for chills, fever and weight loss.  HENT:  Negative for tinnitus.   Respiratory:  Negative for cough, sputum production and shortness of breath.   Cardiovascular:  Positive for leg swelling. Negative for chest pain and palpitations.  Gastrointestinal:  Negative for abdominal pain, constipation, diarrhea and heartburn.  Genitourinary:  Negative for dysuria, frequency and urgency.  Musculoskeletal:  Negative for back pain, falls, joint pain and myalgias.  Skin: Negative.   Neurological:  Negative for dizziness and headaches.  Psychiatric/Behavioral:  Negative for depression.     Past Medical History:  Diagnosis Date   Atrial  fibrillation [facility])    Per [place] new patient packet   BPH (benign prostatic hyperplasia)    Elevated PSA    Enlarged prostate    High cholesterol    Hx of colonoscopy 2016   Per [place] new patient packet   Hypertension    Sleep apnea    Per [place] new patient packet   Slow urinary stream    Past Surgical History:  Procedure Laterality Date   COLONOSCOPY  07/03/2016   COLONOSCOPY WITH PROPOFOL N/A 10/06/2020   Procedure: COLONOSCOPY WITH PROPOFOL;  Surgeon: Jonathon Bellows, MD;  Location: [facility] ENDOSCOPY;  Service: Gastroenterology;  Laterality: N/A;   HERNIA REPAIR Right 2010   inguinal hernia    PROSTATE BIOPSY     TONSILLECTOMY     Social History:   reports that he has never smoked. He has never used smokeless tobacco. He reports current alcohol use of about 7.0 standard drinks of alcohol per week. He reports that he does not use drugs.  Family History  Problem Relation Age of Onset   Aneurysm Father    Heart attack Father    Heart disease Father     Medications: Patient's Medications  New Prescriptions   No medications on file  Previous Medications   DOCUSATE CALCIUM (STOOL SOFTENER PO)    Take by mouth daily. As needed   METOPROLOL SUCCINATE (TOPROL-XL) 25 MG 24 HR TABLET    TAKE ONE TABLET BY MOUTH EVERY DAY   POLYETHYLENE  GLYCOL (MIRALAX / GLYCOLAX) 17 G PACKET    Take 17 g by mouth as needed.   PRAVASTATIN (PRAVACHOL) 20 MG TABLET    TAKE ONE TABLET BY MOUTH EVERY DAY   PSYLLIUM FIBER PO    Take by mouth daily. As needed   TAMSULOSIN (FLOMAX) 0.4 MG CAPS CAPSULE    Take 1 capsule (0.4 mg total) by mouth daily. Take 2 capsules at bedtime   TIZANIDINE (ZANAFLEX) 2 MG CAPSULE    Take 1 capsule (2 mg total) by mouth 3 (three) times daily as needed for muscle spasms.   XARELTO 20 MG TABS TABLET    TAKE ONE TABLET BY MOUTH EVERY DAY WITH EVENING MEAL  Modified Medications   No medications on file  Discontinued Medications   No medications on file    Physical Exam:  Vitals:    08/09/21 1058  BP: (!) 146/80  Pulse: 70  Temp: 98.4 F (36.9 C)  TempSrc: Temporal  SpO2: 98%  Weight: 189 lb (85.7 kg)  Height: '5\' 7"'$  (1.702 m)   Body mass index is 29.6 kg/m. Wt Readings from Last 3 Encounters:  08/09/21 189 lb (85.7 kg)  06/16/21 173 lb (78.5 kg)  12/16/20 184 lb (83.5 kg)    Physical Exam Constitutional:      General: He is not in acute distress.    Appearance: He is well-developed. He is not diaphoretic.  HENT:     Head: Normocephalic and atraumatic.     Right Ear: External ear normal.     Left Ear: External ear normal.     Mouth/Throat:     Pharynx: No oropharyngeal exudate.  Eyes:     Conjunctiva/sclera: Conjunctivae normal.     Pupils: Pupils are equal, round, and reactive to light.  Cardiovascular:     Rate and Rhythm: Normal rate and regular rhythm.     Heart sounds: Normal heart sounds.  Pulmonary:     Effort: Pulmonary effort is normal.     Breath sounds: Normal breath sounds.  Abdominal:     General: Bowel sounds are normal.     Palpations: Abdomen is soft.  Musculoskeletal:        General: No tenderness.     Cervical back: Normal range of motion and neck supple.     Right lower leg: 2+ Pitting Edema present.     Left lower leg: 2+ Pitting Edema present.  Skin:    General: Skin is warm and dry.  Neurological:     Mental Status: He is alert and oriented to person, place, and time.     Labs reviewed: Basic Metabolic Panel: Recent Labs    12/10/20 0000 06/13/21 0000  NA 141 138  K 4.0 4.2  CL 103 102  CO2 27* 30*  BUN 17 17  CREATININE 1.0 0.9  CALCIUM 9.2 9.6   Liver Function Tests: Recent Labs    12/10/20 0000 06/13/21 0000 06/21/21 0000  AST '24 24 22  '$ ALT '18 22 20  '$ ALKPHOS 106 131* 110  ALBUMIN 4.1 4.3 4.3   No results for input(s): "LIPASE", "AMYLASE" in the last 8760 hours. No results for input(s): "AMMONIA" in the last 8760 hours. CBC: Recent Labs    12/10/20 0000 06/13/21 0000  WBC 7.5 5.9   NEUTROABS 4,298.00 3,475.00  HGB 14.0 13.9  HCT 42 40*  PLT 140* 151   Lipid Panel: Recent Labs    06/13/21 0000  CHOL 149  HDL 79*  LDLCALC 57  TRIG 53   TSH: No results for input(s): "TSH" in the last 8760 hours. A1C: No results found for: "HGBA1C"   Assessment/Plan 1. Bilateral leg edema -progressively worse LE edema --encouraged to elevate legs above level of heart as tolerates, low sodium diet, compression hose as tolerates (on in am, off in pm) - furosemide (LASIX) 20 MG tablet; Take 1 tablet (20 mg total) by mouth daily.  Dispense: 30 tablet; Refill: 3 - ECHOCARDIOGRAM COMPLETE; Future -follow up BMP in 2 weeks   2. Paroxysmal atrial fibrillation (HCC) -rate has been controlled and continues on xarelto for anticoagulation. Due to worsening LE edema will follow up echo - ECHOCARDIOGRAM COMPLETE; Future  3. Do not resuscitate - Do not attempt resuscitation (DNR)   Next appt: 2 weeks for follow up LE edema Mousa Prout K. [place], [facility] [phone]

## 2021-08-09 NOTE — Patient Instructions (Addendum)
To start lasix 20 mg daily -encouraged to elevate legs above level of heart as tolerates -low sodium diet -compression hose as tolerates (on in am, off in pm)  Follow up lab - Thursday 8/24 at [facility]

## 2021-08-12 ENCOUNTER — Encounter: Payer: Self-pay | Admitting: Nurse Practitioner

## 2021-08-24 ENCOUNTER — Encounter: Payer: Self-pay | Admitting: Nurse Practitioner

## 2021-08-25 ENCOUNTER — Encounter: Payer: Self-pay | Admitting: Nurse Practitioner

## 2021-08-25 ENCOUNTER — Ambulatory Visit: Payer: Medicare PPO | Admitting: Nurse Practitioner

## 2021-08-25 VITALS — BP 138/78 | HR 50 | Resp 18 | Wt 171.0 lb

## 2021-08-25 DIAGNOSIS — I480 Paroxysmal atrial fibrillation: Secondary | ICD-10-CM | POA: Diagnosis not present

## 2021-08-25 DIAGNOSIS — R600 Localized edema: Secondary | ICD-10-CM | POA: Diagnosis not present

## 2021-08-25 DIAGNOSIS — I482 Chronic atrial fibrillation, unspecified: Secondary | ICD-10-CM | POA: Diagnosis not present

## 2021-08-25 LAB — BASIC METABOLIC PANEL
BUN: 23 — AB (ref 4–21)
CO2: 30 — AB (ref 13–22)
Chloride: 101 (ref 99–108)
Creatinine: 0.9 (ref 0.6–1.3)
Glucose: 93
Potassium: 4 mEq/L (ref 3.5–5.1)
Sodium: 138 (ref 137–147)

## 2021-08-25 LAB — COMPREHENSIVE METABOLIC PANEL
Calcium: 9.7 (ref 8.7–10.7)
eGFR: 83

## 2021-08-25 NOTE — Progress Notes (Signed)
Careteam: Patient Care Team: Lauree Chandler, NP as PCP - General (Geriatric Medicine)  Advanced Directive information Does Patient Have a Medical Advance Directive?: Yes, Type of Advance Directive: [place];Living will;Out of facility DNR (pink MOST or yellow form), Pre-existing out of facility DNR order (yellow form or pink MOST form): Yellow form placed in chart (order not valid for inpatient use), Does patient want to make changes to medical advance directive?: No - Patient declined  No Known Allergies  Chief Complaint  Patient presents with   Follow-up    Follow up on swelling.      HPI: Patient is a 76 y.o. male seen in today at the [facility] for follow up LE edema.  He was placed on lasix reports it is working and LE edema has improved  Reports dramatic improvement in legs. Knees no longer swollen.   States he is having less shortness of breath with movement- also contributes this to his a fib.   He is down 18 lbs since his last visit.  He is trying to cut back on sodium  No longer drinking ETOH.   Denies palpitations, chest pains, dizziness, or lightheadness.   Review of Systems:  Review of Systems  Constitutional:  Negative for chills, fever and weight loss.  HENT:  Negative for tinnitus.   Respiratory:  Negative for cough, sputum production and shortness of breath.   Cardiovascular:  Positive for leg swelling (improved). Negative for chest pain and palpitations.  Gastrointestinal:  Negative for abdominal pain, constipation, diarrhea and heartburn.  Genitourinary:  Positive for frequency. Negative for dysuria and urgency.  Skin: Negative.   Neurological:  Negative for dizziness and headaches.    Past Medical History:  Diagnosis Date   Atrial fibrillation [facility])    Per [place] new patient packet   BPH (benign prostatic hyperplasia)    Elevated PSA    Enlarged prostate    High cholesterol    Hx of colonoscopy 2016   Per [place] new  patient packet   Hypertension    Sleep apnea    Per [place] new patient packet   Slow urinary stream    Past Surgical History:  Procedure Laterality Date   COLONOSCOPY  07/03/2016   COLONOSCOPY WITH PROPOFOL N/A 10/06/2020   Procedure: COLONOSCOPY WITH PROPOFOL;  Surgeon: Jonathon Bellows, MD;  Location: [place] ENDOSCOPY;  Service: Gastroenterology;  Laterality: N/A;   HERNIA REPAIR Right 2010   inguinal hernia    PROSTATE BIOPSY     TONSILLECTOMY     Social History:   reports that he has never smoked. He has never used smokeless tobacco. He reports current alcohol use of about 7.0 standard drinks of alcohol per week. He reports that he does not use drugs.  Family History  Problem Relation Age of Onset   Aneurysm Father    Heart attack Father    Heart disease Father     Medications: Patient's Medications  New Prescriptions   No medications on file  Previous Medications   DOCUSATE CALCIUM (STOOL SOFTENER PO)    Take by mouth daily. As needed   FUROSEMIDE (LASIX) 20 MG TABLET    Take 1 tablet (20 mg total) by mouth daily.   METOPROLOL SUCCINATE (TOPROL-XL) 25 MG 24 HR TABLET    TAKE ONE TABLET BY MOUTH EVERY DAY   POLYETHYLENE GLYCOL (MIRALAX / GLYCOLAX) 17 G PACKET    Take 17 g by mouth as needed.   PRAVASTATIN (PRAVACHOL)  20 MG TABLET    TAKE ONE TABLET BY MOUTH EVERY DAY   PSYLLIUM FIBER PO    Take by mouth daily. As needed   TAMSULOSIN (FLOMAX) 0.4 MG CAPS CAPSULE    Take 1 capsule (0.4 mg total) by mouth daily. Take 2 capsules at bedtime   TIZANIDINE (ZANAFLEX) 2 MG CAPSULE    Take 1 capsule (2 mg total) by mouth 3 (three) times daily as needed for muscle spasms.   XARELTO 20 MG TABS TABLET    TAKE ONE TABLET BY MOUTH EVERY DAY WITH EVENING MEAL  Modified Medications   No medications on file  Discontinued Medications   No medications on file    Physical Exam:  Vitals:   08/25/21 1004  BP: 138/78  Pulse: (!) 50  Resp: 18  SpO2: 94%  Weight: 171 lb (77.6 kg)   Body mass  index is 26.78 kg/m. Wt Readings from Last 3 Encounters:  08/25/21 171 lb (77.6 kg)  08/09/21 189 lb (85.7 kg)  06/16/21 173 lb (78.5 kg)    Physical Exam Constitutional:      General: He is not in acute distress.    Appearance: He is well-developed. He is not diaphoretic.  HENT:     Head: Normocephalic and atraumatic.     Right Ear: External ear normal.     Left Ear: External ear normal.     Mouth/Throat:     Pharynx: No oropharyngeal exudate.  Eyes:     Conjunctiva/sclera: Conjunctivae normal.     Pupils: Pupils are equal, round, and reactive to light.  Cardiovascular:     Rate and Rhythm: Normal rate and regular rhythm.     Heart sounds: Normal heart sounds.  Pulmonary:     Effort: Pulmonary effort is normal.     Breath sounds: Normal breath sounds.  Abdominal:     General: Bowel sounds are normal.     Palpations: Abdomen is soft.  Musculoskeletal:        General: No tenderness.     Cervical back: Normal range of motion and neck supple.     Right lower leg: Edema (1+) present.     Left lower leg: Edema (1+) present.  Skin:    General: Skin is warm and dry.  Neurological:     Mental Status: He is alert and oriented to person, place, and time.     Labs reviewed: Basic Metabolic Panel: Recent Labs    12/10/20 0000 06/13/21 0000  NA 141 138  K 4.0 4.2  CL 103 102  CO2 27* 30*  BUN 17 17  CREATININE 1.0 0.9  CALCIUM 9.2 9.6   Liver Function Tests: Recent Labs    12/10/20 0000 06/13/21 0000 06/21/21 0000  AST '24 24 22  '$ ALT '18 22 20  '$ ALKPHOS 106 131* 110  ALBUMIN 4.1 4.3 4.3   No results for input(s): "LIPASE", "AMYLASE" in the last 8760 hours. No results for input(s): "AMMONIA" in the last 8760 hours. CBC: Recent Labs    12/10/20 0000 06/13/21 0000  WBC 7.5 5.9  NEUTROABS 4,298.00 3,475.00  HGB 14.0 13.9  HCT 42 40*  PLT 140* 151   Lipid Panel: Recent Labs    06/13/21 0000  CHOL 149  HDL 79*  LDLCALC 57  TRIG 53   TSH: No results  for input(s): "TSH" in the last 8760 hours. A1C: No results found for: "HGBA1C"   Assessment/Plan 1. Bilateral leg edema --has significant improved on lasix. Will continue  at this time.  - Ambulatory referral to Cardiology  2. Paroxysmal atrial fibrillation (HCC) -bradycardia noted today which he occasionally has, overall asymptomatic of this and does not have palpitations.  Will increase in LE edema and a fib will refer to cardiology for further evaluation and recommendation- echo also has been ordered and awaiting this to be scheduled.  - Ambulatory referral to Cardiology    Harris Penton K. [place], [facility] [phone]

## 2021-08-25 NOTE — Patient Instructions (Signed)
[place] at [facility] in [place], [place] Address: [id number], [place], [place] [address] [place] at [facility] Phone: [phone]

## 2021-08-26 ENCOUNTER — Telehealth: Payer: Self-pay | Admitting: Nurse Practitioner

## 2021-08-26 NOTE — Telephone Encounter (Signed)
Lab work looks good, continue current regimen.

## 2021-08-26 NOTE — Telephone Encounter (Signed)
-----   Message from [name], [place] sent at 08/25/2021  3:27 PM EDT ----- abstracted

## 2021-08-26 NOTE — Telephone Encounter (Signed)
LMOM for patient to return call.

## 2021-08-26 NOTE — Telephone Encounter (Signed)
Patient has been notified of [place].

## 2021-08-26 NOTE — Telephone Encounter (Signed)
Patient notified and agreed.  

## 2021-09-02 ENCOUNTER — Ambulatory Visit
Admission: RE | Admit: 2021-09-02 | Discharge: 2021-09-02 | Disposition: A | Discharge status: Home / Self Care | Payer: Medicare PPO | Source: Ambulatory Visit | Attending: Nurse Practitioner | Admitting: Nurse Practitioner

## 2021-09-02 DIAGNOSIS — I361 Nonrheumatic tricuspid (valve) insufficiency: Secondary | ICD-10-CM

## 2021-09-02 DIAGNOSIS — J90 Pleural effusion, not elsewhere classified: Secondary | ICD-10-CM | POA: Insufficient documentation

## 2021-09-02 DIAGNOSIS — R600 Localized edema: Secondary | ICD-10-CM

## 2021-09-02 DIAGNOSIS — I071 Rheumatic tricuspid insufficiency: Secondary | ICD-10-CM | POA: Diagnosis not present

## 2021-09-02 DIAGNOSIS — G4730 Sleep apnea, unspecified: Secondary | ICD-10-CM | POA: Diagnosis not present

## 2021-09-02 DIAGNOSIS — I10 Essential (primary) hypertension: Secondary | ICD-10-CM | POA: Insufficient documentation

## 2021-09-02 DIAGNOSIS — I4891 Unspecified atrial fibrillation: Secondary | ICD-10-CM | POA: Insufficient documentation

## 2021-09-02 DIAGNOSIS — I480 Paroxysmal atrial fibrillation: Secondary | ICD-10-CM

## 2021-09-02 LAB — ECHOCARDIOGRAM COMPLETE
AR max vel: 2.45 cm2
AV Area VTI: 2.46 cm2
AV Area mean vel: 2.5 cm2
AV Mean grad: 2.5 mmHg
AV Peak grad: 4.5 mmHg
Ao pk vel: 1.07 m/s
Area-P 1/2: 5.58 cm2
S' Lateral: 2.6 cm

## 2021-09-02 NOTE — Progress Notes (Signed)
*  PRELIMINARY RESULTS* Echocardiogram 2D Echocardiogram has been performed.  Sherrie Sport 09/02/2021, 9:54 AM

## 2021-09-13 ENCOUNTER — Ambulatory Visit: Payer: Medicare PPO | Admitting: Nurse Practitioner

## 2021-09-13 ENCOUNTER — Other Ambulatory Visit: Payer: Self-pay | Admitting: Nurse Practitioner

## 2021-09-14 NOTE — Telephone Encounter (Signed)
This was filled for 6 months back on 08/02/21.

## 2021-09-15 ENCOUNTER — Ambulatory Visit (INDEPENDENT_AMBULATORY_CARE_PROVIDER_SITE_OTHER): Payer: Medicare PPO | Admitting: Nurse Practitioner

## 2021-09-15 ENCOUNTER — Encounter: Payer: Self-pay | Admitting: Nurse Practitioner

## 2021-09-15 DIAGNOSIS — Z0000 Encounter for general adult medical examination without abnormal findings: Secondary | ICD-10-CM | POA: Diagnosis not present

## 2021-09-15 NOTE — Patient Instructions (Signed)
Randy Montgomery , Thank you for taking time to come for your Medicare Wellness Visit. I appreciate your ongoing commitment to your health goals. Please review the following plan we discussed and let me know if I can assist you in the future.   Screening recommendations/referrals: Colonoscopy aged out Recommended yearly ophthalmology/optometry visit for glaucoma screening and checkup Recommended yearly dental visit for hygiene and checkup  Vaccinations: Influenza vaccine due annually in September/October Pneumococcal vaccine up to date Tdap vaccine DUE- recommend to get at your local pharmacy Shingles vaccine up to date    Advanced directives: on file.   Conditions/risks identified: advanced age.   Next appointment: yearly- next in person  Preventive Care 14 Years and Older, Male Preventive care refers to lifestyle choices and visits with your health care provider that can promote health and wellness. What does preventive care include? A yearly physical exam. This is also called an annual well check. Dental exams once or twice a year. Routine eye exams. Ask your health care provider how often you should have your eyes checked. Personal lifestyle choices, including: Daily care of your teeth and gums. Regular physical activity. Eating a healthy diet. Avoiding tobacco and drug use. Limiting alcohol use. Practicing safe sex. Taking low doses of aspirin every day. Taking vitamin and mineral supplements as recommended by your health care provider. What happens during an annual well check? The services and screenings done by your health care provider during your annual well check will depend on your age, overall health, lifestyle risk factors, and family history of disease. Counseling  Your health care provider may ask you questions about your: Alcohol use. Tobacco use. Drug use. Emotional well-being. Home and relationship well-being. Sexual activity. Eating habits. History of  falls. Memory and ability to understand (cognition). Work and work Statistician. Screening  You may have the following tests or measurements: Height, weight, and BMI. Blood pressure. Lipid and cholesterol levels. These may be checked every 5 years, or more frequently if you are over 54 years old. Skin check. Lung cancer screening. You may have this screening every year starting at age 39 if you have a 30-pack-year history of smoking and currently smoke or have quit within the past 15 years. Fecal occult blood test (FOBT) of the stool. You may have this test every year starting at age 44. Flexible sigmoidoscopy or colonoscopy. You may have a sigmoidoscopy every 5 years or a colonoscopy every 10 years starting at age 56. Prostate cancer screening. Recommendations will vary depending on your family history and other risks. Hepatitis C blood test. Hepatitis B blood test. Sexually transmitted disease (STD) testing. Diabetes screening. This is done by checking your blood sugar (glucose) after you have not eaten for a while (fasting). You may have this done every 1-3 years. Abdominal aortic aneurysm (AAA) screening. You may need this if you are a current or former smoker. Osteoporosis. You may be screened starting at age 69 if you are at high risk. Talk with your health care provider about your test results, treatment options, and if necessary, the need for more tests. Vaccines  Your health care provider may recommend certain vaccines, such as: Influenza vaccine. This is recommended every year. Tetanus, diphtheria, and acellular pertussis (Tdap, Td) vaccine. You may need a Td booster every 10 years. Zoster vaccine. You may need this after age 103. Pneumococcal 13-valent conjugate (PCV13) vaccine. One dose is recommended after age 14. Pneumococcal polysaccharide (PPSV23) vaccine. One dose is recommended after age 51. Talk to  your health care provider about which screenings and vaccines you need and  how often you need them. This information is not intended to replace advice given to you by your health care provider. Make sure you discuss any questions you have with your health care provider. Document Released: 01/15/2015 Document Revised: 09/08/2015 Document Reviewed: 10/20/2014 Elsevier Interactive Patient Education  2017 [employer] Prevention in the Home Falls can cause injuries. They can happen to people of all ages. There are many things you can do to make your home safe and to help prevent falls. What can I do on the outside of my home? Regularly fix the edges of walkways and driveways and fix any cracks. Remove anything that might make you trip as you walk through a door, such as a raised step or threshold. Trim any bushes or trees on the path to your home. Use bright outdoor lighting. Clear any walking paths of anything that might make someone trip, such as rocks or tools. Regularly check to see if handrails are loose or broken. Make sure that both sides of any steps have handrails. Any raised decks and porches should have guardrails on the edges. Have any leaves, snow, or ice cleared regularly. Use sand or salt on walking paths during winter. Clean up any spills in your garage right away. This includes oil or grease spills. What can I do in the bathroom? Use night lights. Install grab bars by the toilet and in the tub and shower. Do not use towel bars as grab bars. Use non-skid mats or decals in the tub or shower. If you need to sit down in the shower, use a plastic, non-slip stool. Keep the floor dry. Clean up any water that spills on the floor as soon as it happens. Remove soap buildup in the tub or shower regularly. Attach bath mats securely with double-sided non-slip rug tape. Do not have throw rugs and other things on the floor that can make you trip. What can I do in the bedroom? Use night lights. Make sure that you have a light by your bed that is easy to  reach. Do not use any sheets or blankets that are too big for your bed. They should not hang down onto the floor. Have a firm chair that has side arms. You can use this for support while you get dressed. Do not have throw rugs and other things on the floor that can make you trip. What can I do in the kitchen? Clean up any spills right away. Avoid walking on wet floors. Keep items that you use a lot in easy-to-reach places. If you need to reach something above you, use a strong step stool that has a grab bar. Keep electrical cords out of the way. Do not use floor polish or wax that makes floors slippery. If you must use wax, use non-skid floor wax. Do not have throw rugs and other things on the floor that can make you trip. What can I do with my stairs? Do not leave any items on the stairs. Make sure that there are handrails on both sides of the stairs and use them. Fix handrails that are broken or loose. Make sure that handrails are as long as the stairways. Check any carpeting to make sure that it is firmly attached to the stairs. Fix any carpet that is loose or worn. Avoid having throw rugs at the top or bottom of the stairs. If you do have throw rugs, attach  them to the floor with carpet tape. Make sure that you have a light switch at the top of the stairs and the bottom of the stairs. If you do not have them, ask someone to add them for you. What else can I do to help prevent falls? Wear shoes that: Do not have high heels. Have rubber bottoms. Are comfortable and fit you well. Are closed at the toe. Do not wear sandals. If you use a stepladder: Make sure that it is fully opened. Do not climb a closed stepladder. Make sure that both sides of the stepladder are locked into place. Ask someone to hold it for you, if possible. Clearly mark and make sure that you can see: Any grab bars or handrails. First and last steps. Where the edge of each step is. Use tools that help you move  around (mobility aids) if they are needed. These include: Canes. Walkers. Scooters. Crutches. Turn on the lights when you go into a dark area. Replace any light bulbs as soon as they burn out. Set up your furniture so you have a clear path. Avoid moving your furniture around. If any of your floors are uneven, fix them. If there are any pets around you, be aware of where they are. Review your medicines with your doctor. Some medicines can make you feel dizzy. This can increase your chance of falling. Ask your doctor what other things that you can do to help prevent falls. This information is not intended to replace advice given to you by your health care provider. Make sure you discuss any questions you have with your health care provider. Document Released: 10/15/2008 Document Revised: 05/27/2015 Document Reviewed: 01/23/2014 Elsevier Interactive Patient Education  2017 [employer].

## 2021-09-15 NOTE — Progress Notes (Signed)
Subjective:   Ramari Bray is a 76 y.o. male who presents for Medicare Annual/Subsequent preventive examination.  Review of Systems     Cardiac Risk Factors include: advanced age (>60mn, >>64women);hypertension     Objective:    There were no vitals filed for this visit. There is no height or weight on file to calculate BMI.     09/15/2021   11:52 AM 08/24/2021   11:07 AM 08/09/2021   10:57 AM 06/16/2021    9:24 AM 12/16/2020   11:12 AM 10/06/2020   12:06 PM 09/09/2020    9:55 AM  Advanced Directives  Does Patient Have a Medical Advance Directive? Yes Yes Yes Yes Yes Yes Yes  Type of AParamedicof [place]Living will;Out of facility DNR (pink MOST or yellow form) [place] will;Out of facility DNR (pink MOST or yellow form) HC[place]ared[place]Living will;Out of facility DNR (pink MOST or yellow form) HFirestoneLiving will;Out of facility DNR (pink MOST or yellow form) HLinnLiving will;Out of facility DNR (pink MOST or yellow form)  Out of facility DNR (pink MOST or yellow form)  Does patient want to make changes to medical advance directive? No - Patient declined No - Patient declined No - Patient declined No - Patient declined   No - Patient declined  Copy of HSelinsgrovein Chart? Yes - validated most recent copy scanned in chart (See row information) Yes - validated most recent copy scanned in chart (See row information) Yes - validated most recent copy scanned in chart (See row information) Yes - validated most recent copy scanned in chart (See row information) Yes - validated most recent copy scanned in chart (See row information)    Pre-existing out of facility DNR order (yellow form or pink MOST form) Yellow form placed in chart (order not valid for inpatient use) Yellow form placed in chart (order not valid for inpatient use)   Yellow form placed in chart (order not  valid for inpatient use)  Yellow form placed in chart (order not valid for inpatient use)    Current Medications (verified) Outpatient Encounter Medications as of 09/15/2021  Medication Sig   furosemide (LASIX) 20 MG tablet Take 1 tablet (20 mg total) by mouth daily.   metoprolol succinate (TOPROL-XL) 25 MG 24 hr tablet TAKE ONE TABLET BY MOUTH EVERY DAY   polyethylene glycol (MIRALAX / GLYCOLAX) 17 g packet Take 17 g by mouth as needed.   pravastatin (PRAVACHOL) 20 MG tablet TAKE ONE TABLET BY MOUTH EVERY DAY   tamsulosin (FLOMAX) 0.4 MG CAPS capsule Take 1 capsule (0.4 mg total) by mouth daily. Take 2 capsules at bedtime   tizanidine (ZANAFLEX) 2 MG capsule Take 1 capsule (2 mg total) by mouth 3 (three) times daily as needed for muscle spasms.   XARELTO 20 MG TABS tablet TAKE ONE TABLET BY MOUTH EVERY DAY WITH EVENING MEAL   [DISCONTINUED] Docusate Calcium (STOOL SOFTENER PO) Take by mouth daily. As needed   [DISCONTINUED] PSYLLIUM FIBER PO Take by mouth daily. As needed   No facility-administered encounter medications on file as of 09/15/2021.    Allergies (verified) Patient has no known allergies.   History: Past Medical History:  Diagnosis Date   Atrial fibrillation ([place]    [place]   BPH (benign prostatic hyperplasia)    Elevated PSA    Enlarged prostate    High cholesterol  Hx of colonoscopy 2016   Per [place] new patient packet   Hypertension    Sleep apnea    Per [place] new patient packet   Slow urinary stream    Past Surgical History:  Procedure Laterality Date   COLONOSCOPY  07/03/2016   COLONOSCOPY WITH PROPOFOL N/A 10/06/2020   Procedure: COLONOSCOPY WITH PROPOFOL;  Surgeon: Jonathon Bellows, MD;  [facility] ENDOSCOPY;  Service: Gastroenterology;  Laterality: N/A;   HERNIA REPAIR Right 2010   inguinal hernia    PROSTATE BIOPSY     TONSILLECTOMY     Family History  Problem Relation Age of Onset   Aneurysm Father    Heart attack Father    Heart  disease Father    Social History   Socioeconomic History   Marital status: Married    Spouse name: Not on file   Number of children: Not on file   Years of education: Not on file   Highest education level: Not on file  Occupational History   Not on file  Tobacco Use   Smoking status: Never   Smokeless tobacco: Never  Vaping Use   Vaping Use: Never used  Substance and Sexual Activity   Alcohol use: Yes    Alcohol/week: 6.0 standard drinks of alcohol    Types: 6 Standard drinks or equivalent per week    Comment: burbon 1 drinks a night   Drug use: Never   Sexual activity: Not on file  Other Topics Concern   Not on file  Social History Narrative   Diet      Do you drink/eat things with caffeine Yes      Marital Status Married  What year were you married? 1968      Do you live in a house, apartment, assisted living, condo, trailer, etc.? Assisted living      Is it one or more stories? 1      How many persons live in your home? 2         Do you have any pets in your home?(please list): No      Highest level of education completed: BA, [employer] 1967      Current or past profession: Civil engineer, contracting      Do you exercise?: Moderately  Type and how often: [place]ak, use wellnes equipment 2-3 times a week      Living Will?      DNR form? Yes    If not, do you wish to discuss one?       POA/HPOA forms? Yes      Difficulty bathing or dressing yourself? Patient did not answer these questions on new patient packet      Difficulty preparing food or eating?      Difficulty managing medications?      Difficulty managing your finances?      Difficulty affording your medications?                     Social Determinants of Health   Financial Resource Strain: Not on file  Food Insecurity: Not on file  Transportation Needs: Not on file  Physical Activity: Not on file  Stress: Not on file  Social Connections: Not on file    Tobacco Counseling Counseling given: Not  Answered   Clinical Intake:  Pre-visit preparation completed: Yes  Pain : No/denies pain     BMI - recorded: 26 Nutritional Status: BMI 25 -29 Overweight Nutritional Risks: None  How often do you  need to have someone help you when you read instructions, pamphlets, or other written materials from your doctor or pharmacy?: 1 - Never  Diabetic?no         Activities of Daily Living    09/15/2021    1:54 PM  In your present state of health, do you have any difficulty performing the following activities:  Hearing? 1  Vision? 0  Difficulty concentrating or making decisions? 0  Walking or climbing stairs? 0  Dressing or bathing? 0  Doing errands, shopping? 0  Preparing Food and eating ? N  Using the Toilet? N  In the past six months, have you accidently leaked urine? N  Do you have problems with loss of bowel control? N  Managing your Medications? N  Managing your Finances? N  Housekeeping or managing your Housekeeping? N    Patient Care Team: Lauree Chandler, NP as PCP - General (Geriatric Medicine)  Indicate any recent Medical Services you may have received from other than [facility] providers in the past year (date may be approximate).     Assessment:   This is a routine wellness examination for Jefry.  Hearing/Vision screen Hearing Screening - Comments:: Wear hearing aids and they are working well Vision Screening - Comments:: Last eye exam less than 12 months ago, Dr.Bryan  at [place] issues and exercise activities discussed: Current Exercise Habits: Home exercise routine, Type of exercise: stretching, Time (Minutes): 25, Frequency (Times/Week): 3, Weekly Exercise (Minutes/Week): 75   Goals Addressed   None    Depression Screen    09/15/2021   11:53 AM 09/09/2020    9:52 AM 07/24/2019   10:15 AM  PHQ 2/9 Scores  PHQ - 2 Score 0 0 0    Fall Risk    09/15/2021   11:52 AM 08/09/2021    2:00 PM 06/16/2021    9:24 AM 12/16/2020   11:11 AM  09/09/2020    9:54 AM  [place] in the past year? 0 0 0 1 0  Number falls in past yr: 0 0 0 1 0  Injury with Fall? 0 0 0 0 0  Risk for fall due to : No Fall Risks No Fall Risks No Fall Risks History of fall(s) No Fall Risks  Follow up Falls evaluation completed Falls evaluation completed Falls evaluation completed Falls evaluation completed Falls evaluation completed    [place]:  Any stairs in or around the home? Yes  If so, are there any without handrails? No  Home free of loose throw rugs in walkways, pet beds, electrical cords, etc? Yes  Adequate lighting in your home to reduce risk of falls? Yes   ASSISTIVE DEVICES UTILIZED TO PREVENT FALLS:  Life alert? No  Use of a cane, walker or w/c? No  Grab bars in the bathroom? Yes  Shower chair or bench in shower? Yes  Elevated toilet seat or a handicapped toilet? Yes   TIMED UP AND GO:  Was the test performed? No .    Cognitive Function:        09/15/2021    1:35 PM 09/09/2020    9:55 AM  6CIT Screen  What Year? 0 points 0 points  What month? 0 points 0 points  What time? 0 points 0 points  Count back from 20 0 points 0 points  Months in reverse 0 points 2 points  Repeat phrase 2 points 0 points  Total Score  2 points 2 points    Immunizations Immunization History  Administered Date(s) Administered   Influenza Split 10/24/2011   Influenza, High Dose Seasonal PF 10/01/2014, 09/22/2015, 10/14/2020   Influenza, Seasonal, Injecte, Preservative Fre 12/06/1999, 09/26/2012   Influenza,inj,Quad PF,6+ Mos 09/23/2013, 10/01/2014, 10/04/2016, 10/23/2017, 10/24/2017   Influenza-Unspecified 09/23/2013, 10/21/2019   Moderna Covid-19 Vaccine Bivalent Booster 3yr & up 05/31/2021   Moderna Sars-Covid-2 Vaccination 01/16/2019, 02/13/2019, 11/18/2019, 05/20/2020   Pneumococcal Conjugate-13 11/07/2012   Pneumococcal Polysaccharide-23 06/16/2008, 06/25/2009   Td 04/15/1992, 04/05/2002   Tdap  12/16/2010   Tetanus 04/05/2002   Zoster Recombinat (Shingrix) 09/17/2017, 12/19/2017   Zoster, Live 06/10/2007    TDAP status: Due, Education has been provided regarding the importance of this vaccine. Advised may receive this vaccine at local pharmacy or Health Dept. Aware to provide a copy of the vaccination record if obtained from local pharmacy or Health Dept. Verbalized acceptance and understanding.  Flu Vaccine status: Due, Education has been provided regarding the importance of this vaccine. Advised may receive this vaccine at local pharmacy or Health Dept. Aware to provide a copy of the vaccination record if obtained from local pharmacy or Health Dept. Verbalized acceptance and understanding.  Pneumococcal vaccine status: Up to date  Covid-19 vaccine status: Information provided on how to obtain vaccines.   Qualifies for Shingles Vaccine? Yes   Zostavax completed Yes   Shingrix Completed?: Yes  Screening Tests Health Maintenance  Topic Date Due   TETANUS/TDAP  12/15/2020   INFLUENZA VACCINE  08/02/2021   COVID-19 Vaccine (6 - Moderna series) 10/01/2021   Pneumonia Vaccine 76 Years old  Completed   Hepatitis C Screening  Completed   Zoster Vaccines- Shingrix  Completed   HPV VACCINES  Aged Out    Health Maintenance  Health Maintenance Due  Topic Date Due   TETANUS/TDAP  12/15/2020   INFLUENZA VACCINE  08/02/2021    Colorectal cancer screening: No longer required.   Lung Cancer Screening: (Low Dose CT Chest recommended if Age 78-80years, 30 pack-year currently smoking OR have quit w/in 15years.) does not qualify.   Lung Cancer Screening Referral:   Additional Screening:  Hepatitis C Screening: does qualify; Completed 2023  Vision Screening: Recommended annual ophthalmology exams for early detection of glaucoma and other disorders of the eye. Is the patient up to date with their annual eye exam?  Yes  Who is the provider or what is the name of the office in  which the patient attends annual eye exams? [facility] PacificIf pt is not established with a provider, would they like to be referred to a provider to establish care? No .   Dental Screening: Recommended annual dental exams for proper oral hygiene  Community Resource Referral / Chronic Care Management: CRR required this visit?  No   CCM required this visit?  No      Plan:     I have personally reviewed and noted the following in the patient's chart:   Medical and social history Use of alcohol, tobacco or illicit drugs  Current medications and supplements including opioid prescriptions. Patient is not currently taking opioid prescriptions. Functional ability and status Nutritional status Physical activity Advanced directives List of other physicians Hospitalizations, surgeries, and ER visits in previous 12 months Vitals Screenings to include cognitive, depression, and falls Referrals and appointments  In addition, I have reviewed and discussed with patient certain preventive protocols, quality metrics, and best practice recommendations. A written personalized care plan for preventive services as well  as general preventive health recommendations were provided to patient.     Lauree Chandler, NP   09/15/2021    Virtual Visit via Telephone Note  I connected with patient 09/15/21 at  1:40 PM EDT by telephone and verified that I am speaking with the correct person using two identifiers.  Location: Patient: home Provider: [place]    I discussed the limitations, risks, security and privacy concerns of performing an evaluation and management service by telephone and the availability of in person appointments. I also discussed with the patient that there may be a patient responsible charge related to this service. The patient expressed understanding and agreed to proceed.   I discussed the assessment and treatment plan with the patient. The patient was provided an opportunity to  ask questions and all were answered. The patient agreed with the plan and demonstrated an understanding of the instructions.   The patient was advised to call back or seek an in-person evaluation if the symptoms worsen or if the condition fails to improve as anticipated.  I provided 14 minutes of non-face-to-face time during this encounter.  Carlos American. Harle Battiest Avs printed and mailed

## 2021-09-15 NOTE — Progress Notes (Signed)
   This service is provided via telemedicine  No vital signs collected/recorded due to the encounter was a telemedicine visit.   Location of patient (ex: home, work):  Home  Patient consents to a telephone visit: Yes, see telephone visit dated 09/15/21  Location of the provider (ex: office, home):  [place], [place] Location   Name of any referring provider:  N/A  Names of all persons participating in the telemedicine service and their role in the encounter:  S.Chrae B/CMA, Sherrie Mustache, NP, and Patient   Time spent on call:  11 min with medical assistant

## 2021-09-20 ENCOUNTER — Encounter: Payer: Self-pay | Admitting: Nurse Practitioner

## 2021-09-22 ENCOUNTER — Ambulatory Visit (INDEPENDENT_AMBULATORY_CARE_PROVIDER_SITE_OTHER): Payer: Medicare PPO | Admitting: Dermatology

## 2021-09-22 DIAGNOSIS — D1801 Hemangioma of skin and subcutaneous tissue: Secondary | ICD-10-CM | POA: Diagnosis not present

## 2021-09-22 DIAGNOSIS — L821 Other seborrheic keratosis: Secondary | ICD-10-CM

## 2021-09-22 DIAGNOSIS — L817 Pigmented purpuric dermatosis: Secondary | ICD-10-CM | POA: Diagnosis not present

## 2021-09-22 DIAGNOSIS — L570 Actinic keratosis: Secondary | ICD-10-CM

## 2021-09-22 DIAGNOSIS — L820 Inflamed seborrheic keratosis: Secondary | ICD-10-CM

## 2021-09-22 DIAGNOSIS — I872 Venous insufficiency (chronic) (peripheral): Secondary | ICD-10-CM

## 2021-09-22 DIAGNOSIS — Z1283 Encounter for screening for malignant neoplasm of skin: Secondary | ICD-10-CM

## 2021-09-22 DIAGNOSIS — L814 Other melanin hyperpigmentation: Secondary | ICD-10-CM | POA: Diagnosis not present

## 2021-09-22 DIAGNOSIS — L578 Other skin changes due to chronic exposure to nonionizing radiation: Secondary | ICD-10-CM

## 2021-09-22 DIAGNOSIS — Z79899 Other long term (current) drug therapy: Secondary | ICD-10-CM

## 2021-09-22 DIAGNOSIS — D229 Melanocytic nevi, unspecified: Secondary | ICD-10-CM

## 2021-09-22 NOTE — Progress Notes (Signed)
New Patient Visit  Subjective  Randy Montgomery is a 76 y.o. male who presents for the following: Other (New patient - History of Aks - The patient presents for Total-Body Skin Exam (TBSE) for skin cancer screening and mole check.  The patient has spots, moles and lesions to be evaluated, some may be new or changing and the patient has concerns that these could be cancer. ).  The following portions of the chart were reviewed this encounter and updated as appropriate:   Tobacco  Allergies  Meds  Problems  Med Hx  Surg Hx  Fam Hx     Review of Systems:  No other skin or systemic complaints except as noted in HPI or Assessment and Plan.  Objective  Well appearing patient in no apparent distress; mood and affect are within normal limits.  A full examination was performed including scalp, head, eyes, ears, nose, lips, neck, chest, axillae, abdomen, back, buttocks, bilateral upper extremities, bilateral lower extremities, hands, feet, fingers, toes, fingernails, and toenails. All findings within normal limits unless otherwise noted below.  Right scalp, right medial cheek, left antecubital (7) Erythematous stuck-on, waxy papule or plaque  Scalp/forehead (3) Erythematous thin papules/macules with gritty scale.    Assessment & Plan   Lentigines - Scattered tan macules - Due to sun exposure - Benign-appearing, observe - Recommend daily broad spectrum sunscreen SPF 30+ to sun-exposed areas, reapply every 2 hours as needed. - Call for any changes  Seborrheic Keratoses - Stuck-on, waxy, tan-brown papules and/or plaques  - Benign-appearing - Discussed benign etiology and prognosis. - Observe - Call for any changes  Melanocytic Nevi - Tan-brown and/or pink-flesh-colored symmetric macules and papules - Benign appearing on exam today - Observation - Call clinic for new or changing moles - Recommend daily use of broad spectrum spf 30+ sunscreen to sun-exposed areas.    Hemangiomas - Red papules - Discussed benign nature - Observe - Call for any changes  Actinic Damage - Chronic condition, secondary to cumulative UV/sun exposure - diffuse scaly erythematous macules with underlying dyspigmentation - Recommend daily broad spectrum sunscreen SPF 30+ to sun-exposed areas, reapply every 2 hours as needed.  - Staying in the shade or wearing long sleeves, sun glasses (UVA+UVB protection) and wide brim hats (4-inch brim around the entire circumference of the hat) are also recommended for sun protection.  - Call for new or changing lesions.  Skin cancer screening performed today.  Inflamed seborrheic keratosis (7) Right scalp, right medial cheek, left antecubital Recheck right medial cheek - may plan biopsy if persistent. Destruction of lesion - Right scalp, right medial cheek, left antecubital Complexity: simple   Destruction method: cryotherapy   Informed consent: discussed and consent obtained   Timeout:  patient name, date of birth, surgical site, and procedure verified Lesion destroyed using liquid nitrogen: Yes   Region frozen until ice ball extended beyond lesion: Yes   Outcome: patient tolerated procedure well with no complications   Post-procedure details: wound care instructions given    AK (actinic keratosis) (3) Scalp/forehead Destruction of lesion - Scalp/forehead Complexity: simple   Destruction method: cryotherapy   Informed consent: discussed and consent obtained   Timeout:  patient name, date of birth, surgical site, and procedure verified Lesion destroyed using liquid nitrogen: Yes   Region frozen until ice ball extended beyond lesion: Yes   Outcome: patient tolerated procedure well with no complications   Post-procedure details: wound care instructions given    Venous stasis dermatitis of both  lower extremities With Schamberg's Purpura Legs Chronic and persistent condition with duration or expected duration over one year.  Condition is symptomatic / bothersome to patient. Not to goal. Continue Vaseline daily. May restart Tacrolimus oint 5 times per week and Halobetasol cream 2 times per week prn flares.  Return in about 6 weeks (around 11/03/2021) for AK follow up, ISK follow up.  I, Ashok Cordia, CMA, am acting as scribe for Sarina Ser, MD . Documentation: I have reviewed the above documentation for accuracy and completeness, and I agree with the above.  Sarina Ser, MD

## 2021-09-22 NOTE — Patient Instructions (Signed)
Cryotherapy Aftercare  Wash gently with soap and water everyday.   Apply Vaseline and Band-Aid daily until healed.     Due to recent changes in healthcare laws, you may see results of your pathology and/or laboratory studies on MyChart before the doctors have had a chance to review them. We understand that in some cases there may be results that are confusing or concerning to you. Please understand that not all results are received at the same time and often the doctors may need to interpret multiple results in order to provide you with the best plan of care or course of treatment. Therefore, we ask that you please give us 2 business days to thoroughly review all your results before contacting the office for clarification. Should we see a critical lab result, you will be contacted sooner.   If You Need Anything After Your Visit  If you have any questions or concerns for your doctor, please call our main line at [phone] and press option 4 to reach your doctor's medical assistant. If no one answers, please leave a voicemail as directed and we will return your call as soon as possible. Messages left after 4 pm will be answered the following business day.   You may also send us a message via MyChart. We typically respond to MyChart messages within 1-2 business days.  For prescription refills, please ask your pharmacy to contact our office. Our fax number is [phone].  If you have an urgent issue when the clinic is closed that cannot wait until the next business day, you can page your doctor at the number below.    Please note that while we do our best to be available for urgent issues outside of office hours, we are not available 24/7.   If you have an urgent issue and are unable to reach us, you may choose to seek medical care at your doctor's office, retail clinic, urgent care center, or emergency room.  If you have a medical emergency, please immediately call 911 or go to the  emergency department.  Pager Numbers  - Dr. Kowalski: [phone]  - Dr. Moye: [phone]  - Dr. Stewart: [phone]  In the event of inclement weather, please call our main line at [phone] for an update on the status of any delays or closures.  Dermatology Medication Tips: Please keep the boxes that topical medications come in in order to help keep track of the instructions about where and how to use these. Pharmacies typically print the medication instructions only on the boxes and not directly on the medication tubes.   If your medication is too expensive, please contact our office at [phone] option 4 or send us a message through MyChart.   We are unable to tell what your co-pay for medications will be in advance as this is different depending on your insurance coverage. However, we may be able to find a substitute medication at lower cost or fill out paperwork to get insurance to cover a needed medication.   If a prior authorization is required to get your medication covered by your insurance company, please allow us 1-2 business days to complete this process.  Drug prices often vary depending on where the prescription is filled and some pharmacies may offer cheaper prices.  The website www.goodrx.com contains coupons for medications through different pharmacies. The prices here do not account for what the cost may be with help from insurance (it may be cheaper with your insurance), but the website can   give you the price if you did not use any insurance.  - You can print the associated coupon and take it with your prescription to the pharmacy.  - You may also stop by our office during regular business hours and pick up a GoodRx coupon card.  - If you need your prescription sent electronically to a different pharmacy, notify our office through [place] MyChart or by phone at [phone] option 4.     Si Usted Necesita Algo Despus de Su Visita  Tambin puede  enviarnos un mensaje a travs de MyChart. Por lo general respondemos a los mensajes de MyChart en el transcurso de 1 a 2 das hbiles.  Para renovar recetas, por favor pida a su farmacia que se ponga en contacto con nuestra oficina. Nuestro nmero de fax es el [phone].  Si tiene un asunto urgente cuando la clnica est cerrada y que no puede esperar hasta el siguiente da hbil, puede llamar/localizar a su doctor(a) al nmero que aparece a continuacin.   Por favor, tenga en cuenta que aunque hacemos todo lo posible para estar disponibles para asuntos urgentes fuera del horario de oficina, no estamos disponibles las 24 horas del da, los 7 das de la semana.   Si tiene un problema urgente y no puede comunicarse con nosotros, puede optar por buscar atencin mdica  en el consultorio de su doctor(a), en una clnica privada, en un centro de atencin urgente o en una sala de emergencias.  Si tiene una emergencia mdica, por favor llame inmediatamente al 911 o vaya a la sala de emergencias.  Nmeros de bper  - Dr. Kowalski: [phone]  - Dra. Moye: [phone]  - Dra. Stewart: [phone]  En caso de inclemencias del tiempo, por favor llame a nuestra lnea principal al [phone] para una actualizacin sobre el estado de cualquier retraso o cierre.  Consejos para la medicacin en dermatologa: Por favor, guarde las cajas en las que vienen los medicamentos de uso tpico para ayudarle a seguir las instrucciones sobre dnde y cmo usarlos. Las farmacias generalmente imprimen las instrucciones del medicamento slo en las cajas y no directamente en los tubos del medicamento.   Si su medicamento es muy caro, por favor, pngase en contacto con nuestra oficina llamando al [phone] y presione la opcin 4 o envenos un mensaje a travs de MyChart.   No podemos decirle cul ser su copago por los medicamentos por adelantado ya que esto es diferente dependiendo de la cobertura de su seguro.  Sin embargo, es posible que podamos encontrar un medicamento sustituto a menor costo o llenar un formulario para que el seguro cubra el medicamento que se considera necesario.   Si se requiere una autorizacin previa para que su compaa de seguros cubra su medicamento, por favor permtanos de 1 a 2 das hbiles para completar este proceso.  Los precios de los medicamentos varan con frecuencia dependiendo del lugar de dnde se surte la receta y alguna farmacias pueden ofrecer precios ms baratos.  El sitio web www.goodrx.com tiene cupones para medicamentos de diferentes farmacias. Los precios aqu no tienen en cuenta lo que podra costar con la ayuda del seguro (puede ser ms barato con su seguro), pero el sitio web puede darle el precio si no utiliz ningn seguro.  - Puede imprimir el cupn correspondiente y llevarlo con su receta a la farmacia.  - Tambin puede pasar por nuestra oficina durante el horario de atencin regular y recoger una tarjeta de cupones de GoodRx.  -   Si necesita que su receta se enve electrnicamente a una farmacia diferente, informe a nuestra oficina a travs de MyChart de [place] o por telfono llamando al [phone] y presione la opcin 4.  

## 2021-09-23 ENCOUNTER — Other Ambulatory Visit: Payer: Self-pay | Admitting: Nurse Practitioner

## 2021-09-26 ENCOUNTER — Encounter: Payer: Self-pay | Admitting: Nurse Practitioner

## 2021-09-27 ENCOUNTER — Encounter: Payer: Self-pay | Admitting: Dermatology

## 2021-09-29 ENCOUNTER — Encounter: Payer: Self-pay | Admitting: Nurse Practitioner

## 2021-09-30 NOTE — Telephone Encounter (Signed)
Mr.Music, My Name is Engineer, technical sales covering for Dani Gobble sorry to hear about Helen's toe bleeding.I just reviewed her chart and looks like she was not seen by Janett Billow on 09/27/2021.Has the toe healed or does she need to be seen?  Thank You  Aavya Shafer,FNP-C

## 2021-09-30 NOTE — Telephone Encounter (Signed)
Thank you for letting us know. Have a good weekend too.

## 2021-10-23 DIAGNOSIS — M7989 Other specified soft tissue disorders: Secondary | ICD-10-CM | POA: Insufficient documentation

## 2021-10-23 NOTE — Progress Notes (Unsigned)
Cardiology Office Note  Date:  10/24/2021   ID:  Randy Montgomery, DOB 09-14-1945, MRN [id number]  PCP:  Lauree Chandler, NP   Chief Complaint  Patient presents with   New Patient (Initial Visit)    Ref by Sherrie Mustache, NP for bilateral leg swelling and paroxysmal a-fib. Medications reviewed by the patient verbally.     HPI:  Randy Montgomery is a 76 year old gentleman with past medical history of [place] atrial fibrillation, failed cardioversion 2012, dating back to 2011 Hyperlipidemia OSA, on CPAP Ejection fraction 40 to 45% on echocardiogram September 2023 Referred by Sherrie Mustache for leg swelling, paroxysmal atrial fibrillation  Lives at [facility] Notes indicating he takes Xarelto and metoprolol for his atrial fibrillation Lasix for leg swelling No EKG on record  Echocardiogram September 02, 2021 Ejection fraction 40 to 45% RV function moderately reduced Left and right atrium severely dilated Moderate pleural effusion  Started lasix 20 mg daily, leg swelling improving over 2 months Eats banana daily  Active, stretching/exercise 3 x a week 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  PSH:    Past Surgical History:  Procedure Laterality Date   COLONOSCOPY  07/03/2016   COLONOSCOPY WITH PROPOFOL N/A 10/06/2020   Procedure: COLONOSCOPY WITH PROPOFOL;  Surgeon: Jonathon Bellows, MD;  Location: [facility] ENDOSCOPY;  Service: Gastroenterology;  Laterality: N/A;   HERNIA REPAIR Right 2010   inguinal hernia    PROSTATE BIOPSY     TONSILLECTOMY      Current Outpatient Medications   Medication Sig Dispense Refill   furosemide (LASIX) 20 MG tablet Take 1 tablet (20 mg total) by mouth daily. 30 tablet 3   metoprolol succinate (TOPROL-XL) 25 MG 24 hr tablet TAKE ONE TABLET BY MOUTH EVERY DAY 90 tablet 1   polyethylene glycol (MIRALAX / GLYCOLAX) 17 g packet Take 17 g by mouth as needed.     pravastatin (PRAVACHOL) 20 MG tablet TAKE ONE TABLET BY MOUTH EVERY DAY 90 tablet 1   tamsulosin (FLOMAX) 0.4 MG CAPS capsule Take 0.8 mg by mouth daily.     tizanidine (ZANAFLEX) 2 MG capsule Take 1 capsule (2 mg total) by mouth 3 (three) times daily as needed for muscle spasms. 20 capsule 1   XARELTO 20 MG TABS tablet TAKE ONE TABLET BY MOUTH EVERY DAY WITH EVENING MEAL 90 tablet 1   No current facility-administered medications for this visit.    Allergies:   Patient has no known allergies.   Social History:  The patient  reports that he has never smoked. He has never used smokeless tobacco. He reports current alcohol use of about 6.0 standard drinks of alcohol per week. He reports that he does not use drugs.   Family History:   family history includes Aneurysm in his father; Heart attack in his father; Heart disease in his father.    Review of Systems: Review of Systems  Constitutional: Negative.   HENT: Negative.    Respiratory: Negative.    Cardiovascular: Negative.   Gastrointestinal: Negative.   Musculoskeletal: Negative.   Neurological: Negative.   Psychiatric/Behavioral: Negative.    All other systems reviewed  and are negative.   PHYSICAL EXAM: VS:  BP 128/80 (BP Location: Right Arm, Patient Position: Sitting, Cuff Size: Normal)   Pulse (!) 55   Ht '5\' 6"'$  (1.676 m)   Wt 173 lb 4 oz (78.6 kg)   SpO2 98%   BMI 27.96 kg/m  , BMI Body mass index is 27.96 kg/m. GEN: Well nourished, well developed, in no acute distress HEENT: normal Neck: no JVD, carotid bruits, or masses Cardiac: Irregularly irregular, no murmurs, rubs, or gallops,no edema  Respiratory:  clear  to auscultation bilaterally, normal work of breathing GI: soft, nontender, nondistended, + BS MS: no deformity or atrophy Skin: warm and dry, no rash Neuro:  Strength and sensation are intact Psych: euthymic mood, full affect  Recent Labs: 06/13/2021: Hemoglobin 13.9; Platelets 151 06/21/2021: ALT 20 08/25/2021: BUN 23; Creatinine 0.9; Potassium 4.0; Sodium 138    Lipid Panel Lab Results  Component Value Date   CHOL 149 06/13/2021   HDL 79 (A) 06/13/2021   LDLCALC 57 06/13/2021   TRIG 53 06/13/2021      Wt Readings from Last 3 Encounters:  10/24/21 173 lb 4 oz (78.6 kg)  08/25/21 171 lb (77.6 kg)  08/09/21 189 lb (85.7 kg)       ASSESSMENT AND PLAN:  Problem List Items Addressed This Visit       Cardiology Problems   Chronic atrial fibrillation ([place]) - Primary   Relevant Orders   EKG 12-Lead     Other   Leg swelling   Relevant Orders   EKG 12-Lead   OSA (obstructive sleep apnea)   Relevant Orders   EKG 12-Lead   Permanent atrial fibrillation Dating back to 2011, failed cardioversion 2012 by his report Mildly depressed ejection fraction EF 40 to 45%, denies anginal symptoms Low EF possibly secondary to A-fib, no prior echocardiogram available for comparison Bradycardic on metoprolol though asymptomatic, will continue to monitor for now  Cardiomyopathy Mild depressed ejection fraction on echo Denies anginal symptoms, prefers no ischemic work-up at this time as he feels well Excellent risk profile, cholesterol well controlled, non-smoker, no diabetes Would continue Lasix 20 daily Prefers to have follow-up lab work with Sherrie Mustache For low potassium may need supplemental potassium pill  Hyperlipidemia Cholesterol is at goal on the current lipid regimen. No changes to the medications were made.  Essential hypertension Blood pressure is well controlled on today's visit. No changes made to the medications.    Total encounter time more than 50 minutes   Greater than 50% was spent in counseling and coordination of care with the patient    Signed, Esmond Plants, M.D., Ph.D. [place], [place]

## 2021-10-24 ENCOUNTER — Encounter: Payer: Self-pay | Admitting: Cardiovascular Disease

## 2021-10-24 ENCOUNTER — Encounter: Payer: Self-pay | Admitting: Nurse Practitioner

## 2021-10-24 ENCOUNTER — Ambulatory Visit: Payer: Medicare PPO | Attending: Cardiovascular Disease | Admitting: Cardiovascular Disease

## 2021-10-24 VITALS — BP 128/80 | HR 55 | Ht 66.0 in | Wt 173.2 lb

## 2021-10-24 DIAGNOSIS — M7989 Other specified soft tissue disorders: Secondary | ICD-10-CM

## 2021-10-24 DIAGNOSIS — G4733 Obstructive sleep apnea (adult) (pediatric): Secondary | ICD-10-CM | POA: Diagnosis not present

## 2021-10-24 DIAGNOSIS — I482 Chronic atrial fibrillation, unspecified: Secondary | ICD-10-CM

## 2021-10-24 NOTE — Patient Instructions (Signed)
Medication Instructions:  No changes  If you need a refill on your cardiac medications before your next appointment, please call your pharmacy.    Lab work: No new labs needed   Testing/Procedures: No new testing needed   Follow-Up: At [facility], you and your health needs are our priority.  As part of our continuing mission to provide you with exceptional heart care, we have created designated Provider Care Teams.  These Care Teams include your primary Cardiologist (physician) and Advanced Practice Providers (APPs -  Physician Assistants and Nurse Practitioners) who all work together to provide you with the care you need, when you need it.  You will need a follow up appointment in 6 months  Providers on your designated Care Team:   Christopher Berge, NP Ryan Dunn, PA-C Cadence Furth, PA-C  COVID-19 Vaccine Information can be found at: [email or web address] For questions related to vaccine distribution or appointments, please email [email or web address] or call [phone].   

## 2021-10-25 MED ORDER — UNABLE TO FIND: 1 refills | Status: DC

## 2021-10-25 NOTE — Telephone Encounter (Signed)
Order printed at office, can you please fax to company

## 2021-10-31 ENCOUNTER — Ambulatory Visit: Payer: Medicare PPO | Admitting: Gastroenterology

## 2021-10-31 ENCOUNTER — Encounter: Payer: Self-pay | Admitting: Gastroenterology

## 2021-10-31 VITALS — BP 127/64 | HR 58 | Temp 98.9°F | Ht 66.0 in | Wt 169.2 lb

## 2021-10-31 DIAGNOSIS — K7469 Other cirrhosis of liver: Secondary | ICD-10-CM

## 2021-10-31 NOTE — Progress Notes (Signed)
Randy Bellows MD, MRCP(U.K) [address]  [place]  [place], [place] [address]  Main: [phone]  Fax: [phone]   Gastroenterology Consultation  Referring Provider:     Lauree Chandler, NP Primary Care Physician:  Lauree Chandler, NP Primary Gastroenterologist:  Dr. Jonathon Montgomery  Reason for Consultation:     Referred for "liver problems"        HPI:   Randy Montgomery is a 76 y.o. y/o male referred for consultation & management  by  Lauree Chandler, NP.     He has been referred for elevated total bilirubin which was previously around 1.7 over the past when the ER has been fluctuating between 2.5-3.5.  Fractionated levels not available.  Recent ultrasound showed features concerning for cirrhosis with some perihepatic ascites  He denies any illegal drug use.  No doctors.  Normal to treat service.  He does attest that he had drank in excess for over 20 to 30 years on a daily basis has recently cut down.  No family history of liver disease.  Past Medical History:  Diagnosis Date   Atrial fibrillation [facility])    Per [place] new patient packet   BPH (benign prostatic hyperplasia)    Elevated PSA    Enlarged prostate    High cholesterol    Hx of colonoscopy 2016   Per [place] new patient packet   Hypertension    Sleep apnea    Per [place] new patient packet   Slow urinary stream     Past Surgical History:  Procedure Laterality Date   COLONOSCOPY  07/03/2016   COLONOSCOPY WITH PROPOFOL N/A 10/06/2020   Procedure: COLONOSCOPY WITH PROPOFOL;  Surgeon: Randy Bellows, MD;  Location: [facility] ENDOSCOPY;  Service: Gastroenterology;  Laterality: N/A;   HERNIA REPAIR Right 2010   inguinal hernia    PROSTATE BIOPSY     TONSILLECTOMY      Prior to Admission medications   Medication Sig Start Date End Date Taking? Authorizing Provider  furosemide (LASIX) 20 MG tablet Take 1 tablet (20 mg total) by mouth daily. 08/09/21   Lauree Chandler, NP  metoprolol succinate (TOPROL-XL) 25 MG 24 hr  tablet TAKE ONE TABLET BY MOUTH EVERY DAY 06/13/21   Lauree Chandler, NP  polyethylene glycol (MIRALAX / GLYCOLAX) 17 g packet Take 17 g by mouth as needed.    [provider]  pravastatin (PRAVACHOL) 20 MG tablet TAKE ONE TABLET BY MOUTH EVERY DAY 06/27/21   Lauree Chandler, NP  tamsulosin (FLOMAX) 0.4 MG CAPS capsule Take 0.8 mg by mouth daily.    [provider]  tizanidine (ZANAFLEX) 2 MG capsule Take 1 capsule (2 mg total) by mouth 3 (three) times daily as needed for muscle spasms. 03/23/21   Lauree Chandler, NP  UNABLE TO FIND new full-face (Large) cushion masks, headgear and tubing for my ResMed AirSense 10. 10/25/21   Eubanks, Carlos American, NP  XARELTO 20 MG TABS tablet TAKE ONE TABLET BY MOUTH EVERY DAY WITH EVENING MEAL 06/27/21   Lauree Chandler, NP    Family History  Problem Relation Age of Onset   Aneurysm Father    Heart attack Father    Heart disease Father      Social History   Tobacco Use   Smoking status: Never   Smokeless tobacco: Never  Vaping Use   Vaping Use: Never used  Substance Use Topics   Alcohol use: Yes    Alcohol/week: 6.0  standard drinks of alcohol    Types: 6 Standard drinks or equivalent per week    Comment: burbon 1 drinks a night   Drug use: Never    Allergies as of 10/31/2021   (No Known Allergies)    Review of Systems:    All systems reviewed and negative except where noted in HPI.   Physical Exam:  BP 127/64   Pulse (!) 58   Temp 98.9 F (37.2 C)   Ht '5\' 6"'$  (1.676 m)   Wt 169 lb 4 oz (76.8 kg)   BMI 27.32 kg/m  No LMP for male patient. Psych:  Alert and cooperative. Normal mood and affect. General:   Alert,  Well-developed, well-nourished, pleasant and cooperative in NAD Head:  Normocephalic and atraumatic. Eyes:  Sclera clear, no icterus.   Conjunctiva pink. Ears:  Normal auditory acuity. Neurologic:  Alert and oriented x3;  grossly normal neurologically. Psych:  Alert and cooperative. Normal mood and  affect.  Imaging Studies: No results found.  Assessment and Plan:   Randy Montgomery is a 76 y.o. y/o male has been referred for elevated total bilirubin.  Ultrasound shows features suggestive of cirrhosis.  Based on history very likely secondary to chronic excess alcohol consumption over the years.  Discussed findings with the patient.  Plan 1.  Full autoimmune and viral hepatitis work-up 2.  Fractionated total bilirubin. 3.  In view of liver cirrhosis would recommend upper endoscopy to rule out esophageal varices 4.  Right upper quadrant ultrasound t and AFP to screen for [place] 5.  Advised to stop all alcohol consumption I have discussed alternative options, risks & benefits,  which include, but are not limited to, bleeding, infection, perforation,respiratory complication & drug reaction.  The patient agrees with this plan & written consent will be obtained.    Follow up in 3 months  Dr Randy Bellows MD,MRCP(U.K)

## 2021-11-01 ENCOUNTER — Telehealth: Payer: Self-pay

## 2021-11-01 DIAGNOSIS — K7469 Other cirrhosis of liver: Secondary | ICD-10-CM | POA: Diagnosis not present

## 2021-11-01 NOTE — Telephone Encounter (Signed)
Unable to contact patient by phone to give blood thinner holding instructions. Voice message not set up.  Sent message to patient via mychart.  Thanks,  [place], [place]

## 2021-11-02 ENCOUNTER — Ambulatory Visit
Admission: RE | Admit: 2021-11-02 | Discharge: 2021-11-02 | Disposition: A | Discharge status: Home / Self Care | Payer: Medicare PPO | Source: Ambulatory Visit | Attending: Gastroenterology | Admitting: Gastroenterology

## 2021-11-02 DIAGNOSIS — R188 Other ascites: Secondary | ICD-10-CM | POA: Diagnosis not present

## 2021-11-02 DIAGNOSIS — K7460 Unspecified cirrhosis of liver: Secondary | ICD-10-CM | POA: Diagnosis not present

## 2021-11-02 DIAGNOSIS — N281 Cyst of kidney, acquired: Secondary | ICD-10-CM | POA: Diagnosis not present

## 2021-11-02 DIAGNOSIS — K7469 Other cirrhosis of liver: Secondary | ICD-10-CM | POA: Insufficient documentation

## 2021-11-02 NOTE — Progress Notes (Signed)
Needs hep A/ B vaccine

## 2021-11-04 ENCOUNTER — Other Ambulatory Visit: Payer: Self-pay | Admitting: Nurse Practitioner

## 2021-11-04 DIAGNOSIS — R600 Localized edema: Secondary | ICD-10-CM

## 2021-11-04 LAB — ALPHA-1-ANTITRYPSIN: A-1 Antitrypsin: 168 mg/dL (ref 101–187)

## 2021-11-04 LAB — MITOCHONDRIAL/SMOOTH MUSCLE AB PNL
Mitochondrial Ab: <20 Units (ref 0.0–20.0)
Smooth Muscle Ab: 3 Units (ref 0–19)

## 2021-11-04 LAB — HEPATITIS C ANTIBODY: Hep C Virus Ab: NONREACTIVE

## 2021-11-04 LAB — CK: Total CK: 120 U/L (ref 41–331)

## 2021-11-04 LAB — HEPATITIS B CORE ANTIBODY, TOTAL: Hep B Core Total Ab: NEGATIVE

## 2021-11-04 LAB — HIV ANTIBODY (ROUTINE TESTING W REFLEX): HIV Screen 4th Generation wRfx: NONREACTIVE

## 2021-11-04 LAB — IMMUNOGLOBULINS A/E/G/M, SERUM
IgA/Immunoglobulin A, Serum: 199 mg/dL (ref 61–437)
IgE (Immunoglobulin E), Serum: 71 IU/mL (ref 6–495)
IgG (Immunoglobin G), Serum: 1005 mg/dL (ref 603–1613)
IgM (Immunoglobulin M), Srm: 54 mg/dL (ref 15–143)

## 2021-11-04 LAB — CELIAC DISEASE AB SCREEN W/RFX
Antigliadin Abs, IgA: 6 units (ref 0–19)
Transglutaminase IgA: <2 U/mL (ref 0–3)

## 2021-11-04 LAB — ANA: Anti Nuclear Antibody (ANA): NEGATIVE

## 2021-11-04 LAB — HEPATITIS B E ANTIGEN: Hep B E Ag: NEGATIVE

## 2021-11-04 LAB — IRON,TIBC AND FERRITIN PANEL
Ferritin: 334 ng/mL (ref 30–400)
Iron Saturation: 28 % (ref 15–55)
Iron: 82 ug/dL (ref 38–169)
Total Iron Binding Capacity: 294 ug/dL (ref 250–450)
UIBC: 212 ug/dL (ref 111–343)

## 2021-11-04 LAB — AFP TUMOR MARKER: AFP, Serum, Tumor Marker: 3 ng/mL (ref 0.0–8.4)

## 2021-11-04 LAB — CERULOPLASMIN: Ceruloplasmin: 22.7 mg/dL (ref 16.0–31.0)

## 2021-11-04 LAB — HEPATITIS B SURFACE ANTIGEN: Hepatitis B Surface Ag: NEGATIVE

## 2021-11-04 LAB — HEPATITIS B E ANTIBODY: Hep B E Ab: NEGATIVE

## 2021-11-04 LAB — ANTI-MICROSOMAL ANTIBODY LIVER / KIDNEY: LKM1 Ab: 0.9 Units (ref 0.0–20.0)

## 2021-11-04 LAB — HEPATITIS A ANTIBODY, TOTAL: hep A Total Ab: NEGATIVE

## 2021-11-04 LAB — HEPATITIS B SURFACE ANTIBODY,QUALITATIVE: Hep B Surface Ab, Qual: NONREACTIVE

## 2021-11-07 ENCOUNTER — Ambulatory Visit: Payer: Medicare PPO | Admitting: Dermatology

## 2021-11-07 ENCOUNTER — Encounter: Payer: Self-pay | Admitting: Nurse Practitioner

## 2021-11-11 ENCOUNTER — Encounter: Payer: Self-pay | Admitting: Nurse Practitioner

## 2021-11-17 ENCOUNTER — Ambulatory Visit: Payer: Medicare PPO | Admitting: Dermatology

## 2021-11-17 DIAGNOSIS — L820 Inflamed seborrheic keratosis: Secondary | ICD-10-CM | POA: Diagnosis not present

## 2021-11-17 DIAGNOSIS — L821 Other seborrheic keratosis: Secondary | ICD-10-CM

## 2021-11-17 DIAGNOSIS — R238 Other skin changes: Secondary | ICD-10-CM

## 2021-11-17 DIAGNOSIS — L578 Other skin changes due to chronic exposure to nonionizing radiation: Secondary | ICD-10-CM

## 2021-11-17 DIAGNOSIS — L570 Actinic keratosis: Secondary | ICD-10-CM

## 2021-11-17 NOTE — Progress Notes (Signed)
   Follow-Up Visit   Subjective  Randy Montgomery is a 76 y.o. male who presents for the following: Actinic Keratosis (2 month follow up - scalp and forehead treated with LN2/). The patient has spots, moles and lesions to be evaluated, some may be new or changing and the patient has concerns that these could be cancer.  The following portions of the chart were reviewed this encounter and updated as appropriate:   Tobacco  Allergies  Meds  Problems  Med Hx  Surg Hx  Fam Hx     Review of Systems:  No other skin or systemic complaints except as noted in HPI or Assessment and Plan.  Objective  Well appearing patient in no apparent distress; mood and affect are within normal limits.  A focused examination was performed including scalp, face, arms. Relevant physical exam findings are noted in the Assessment and Plan.  Scalp x 4, face x 2 (6) Erythematous thin papules/macules with gritty scale.   Left Ear Purple papule  Face x 5, right arm x 1 Erythematous stuck-on, waxy papule or plaque   Assessment & Plan  AK (actinic keratosis) (6) Scalp x 4, face x 2  Destruction of lesion - Scalp x 4, face x 2 Complexity: simple   Destruction method: cryotherapy   Informed consent: discussed and consent obtained   Timeout:  patient name, date of birth, surgical site, and procedure verified Lesion destroyed using liquid nitrogen: Yes   Region frozen until ice ball extended beyond lesion: Yes   Outcome: patient tolerated procedure well with no complications   Post-procedure details: wound care instructions given    Venous lake Left Ear Benign-appearing.  Observation.  Call clinic for new or changing lesions.  Recommend daily use of broad spectrum spf 30+ sunscreen to sun-exposed areas.   Inflamed seborrheic keratosis Face x 5, right arm x 1  Destruction of lesion - Face x 5, right arm x 1 Complexity: simple   Destruction method: cryotherapy   Informed consent: discussed and consent  obtained   Timeout:  patient name, date of birth, surgical site, and procedure verified Lesion destroyed using liquid nitrogen: Yes   Region frozen until ice ball extended beyond lesion: Yes   Outcome: patient tolerated procedure well with no complications   Post-procedure details: wound care instructions given    Seborrheic Keratoses - Stuck-on, waxy, tan-brown papules and/or plaques  - Benign-appearing - Discussed benign etiology and prognosis. - Observe - Call for any changes  Actinic Damage - chronic, secondary to cumulative UV radiation exposure/sun exposure over time - diffuse scaly erythematous macules with underlying dyspigmentation - Recommend daily broad spectrum sunscreen SPF 30+ to sun-exposed areas, reapply every 2 hours as needed.  - Recommend staying in the shade or wearing long sleeves, sun glasses (UVA+UVB protection) and wide brim hats (4-inch brim around the entire circumference of the hat). - Call for new or changing lesions.  Return in about 1 year (around 11/18/2022) for AK follow up.  I, Ashok Cordia, CMA, am acting as scribe for Sarina Ser, MD . Documentation: I have reviewed the above documentation for accuracy and completeness, and I agree with the above.  Sarina Ser, MD

## 2021-11-17 NOTE — Patient Instructions (Signed)
Cryotherapy Aftercare  Wash gently with soap and water everyday.   Apply Vaseline and Band-Aid daily until healed.     Due to recent changes in healthcare laws, you may see results of your pathology and/or laboratory studies on MyChart before the doctors have had a chance to review them. We understand that in some cases there may be results that are confusing or concerning to you. Please understand that not all results are received at the same time and often the doctors may need to interpret multiple results in order to provide you with the best plan of care or course of treatment. Therefore, we ask that you please give us 2 business days to thoroughly review all your results before contacting the office for clarification. Should we see a critical lab result, you will be contacted sooner.   If You Need Anything After Your Visit  If you have any questions or concerns for your doctor, please call our main line at [phone] and press option 4 to reach your doctor's medical assistant. If no one answers, please leave a voicemail as directed and we will return your call as soon as possible. Messages left after 4 pm will be answered the following business day.   You may also send us a message via MyChart. We typically respond to MyChart messages within 1-2 business days.  For prescription refills, please ask your pharmacy to contact our office. Our fax number is [phone].  If you have an urgent issue when the clinic is closed that cannot wait until the next business day, you can page your doctor at the number below.    Please note that while we do our best to be available for urgent issues outside of office hours, we are not available 24/7.   If you have an urgent issue and are unable to reach us, you may choose to seek medical care at your doctor's office, retail clinic, urgent care center, or emergency room.  If you have a medical emergency, please immediately call 911 or go to the  emergency department.  Pager Numbers  - Dr. Kowalski: [phone]  - Dr. Moye: [phone]  - Dr. Stewart: [phone]  In the event of inclement weather, please call our main line at [phone] for an update on the status of any delays or closures.  Dermatology Medication Tips: Please keep the boxes that topical medications come in in order to help keep track of the instructions about where and how to use these. Pharmacies typically print the medication instructions only on the boxes and not directly on the medication tubes.   If your medication is too expensive, please contact our office at [phone] option 4 or send us a message through MyChart.   We are unable to tell what your co-pay for medications will be in advance as this is different depending on your insurance coverage. However, we may be able to find a substitute medication at lower cost or fill out paperwork to get insurance to cover a needed medication.   If a prior authorization is required to get your medication covered by your insurance company, please allow us 1-2 business days to complete this process.  Drug prices often vary depending on where the prescription is filled and some pharmacies may offer cheaper prices.  The website www.goodrx.com contains coupons for medications through different pharmacies. The prices here do not account for what the cost may be with help from insurance (it may be cheaper with your insurance), but the website can   give you the price if you did not use any insurance.  - You can print the associated coupon and take it with your prescription to the pharmacy.  - You may also stop by our office during regular business hours and pick up a GoodRx coupon card.  - If you need your prescription sent electronically to a different pharmacy, notify our office through [facility] MyChart or by phone at [phone] option 4.     Si Usted Necesita Algo Despus de Su Visita  Tambin puede  enviarnos un mensaje a travs de MyChart. Por lo general respondemos a los mensajes de MyChart en el transcurso de 1 a 2 das hbiles.  Para renovar recetas, por favor pida a su farmacia que se ponga en contacto con nuestra oficina. Nuestro nmero de fax es el [phone].  Si tiene un asunto urgente cuando la clnica est cerrada y que no puede esperar hasta el siguiente da hbil, puede llamar/localizar a su doctor(a) al nmero que aparece a continuacin.   Por favor, tenga en cuenta que aunque hacemos todo lo posible para estar disponibles para asuntos urgentes fuera del horario de oficina, no estamos disponibles las 24 horas del da, los 7 das de la semana.   Si tiene un problema urgente y no puede comunicarse con nosotros, puede optar por buscar atencin mdica  en el consultorio de su doctor(a), en una clnica privada, en un centro de atencin urgente o en una sala de emergencias.  Si tiene una emergencia mdica, por favor llame inmediatamente al 911 o vaya a la sala de emergencias.  Nmeros de bper  - Dr. Kowalski: [phone]  - Dra. Moye: [phone]  - Dra. Stewart: [phone]  En caso de inclemencias del tiempo, por favor llame a nuestra lnea principal al [phone] para una actualizacin sobre el estado de cualquier retraso o cierre.  Consejos para la medicacin en dermatologa: Por favor, guarde las cajas en las que vienen los medicamentos de uso tpico para ayudarle a seguir las instrucciones sobre dnde y cmo usarlos. Las farmacias generalmente imprimen las instrucciones del medicamento slo en las cajas y no directamente en los tubos del medicamento.   Si su medicamento es muy caro, por favor, pngase en contacto con nuestra oficina llamando al [phone] y presione la opcin 4 o envenos un mensaje a travs de MyChart.   No podemos decirle cul ser su copago por los medicamentos por adelantado ya que esto es diferente dependiendo de la cobertura de su seguro.  Sin embargo, es posible que podamos encontrar un medicamento sustituto a menor costo o llenar un formulario para que el seguro cubra el medicamento que se considera necesario.   Si se requiere una autorizacin previa para que su compaa de seguros cubra su medicamento, por favor permtanos de 1 a 2 das hbiles para completar este proceso.  Los precios de los medicamentos varan con frecuencia dependiendo del lugar de dnde se surte la receta y alguna farmacias pueden ofrecer precios ms baratos.  El sitio web www.goodrx.com tiene cupones para medicamentos de diferentes farmacias. Los precios aqu no tienen en cuenta lo que podra costar con la ayuda del seguro (puede ser ms barato con su seguro), pero el sitio web puede darle el precio si no utiliz ningn seguro.  - Puede imprimir el cupn correspondiente y llevarlo con su receta a la farmacia.  - Tambin puede pasar por nuestra oficina durante el horario de atencin regular y recoger una tarjeta de cupones de GoodRx.  -   Si necesita que su receta se enve electrnicamente a una farmacia diferente, informe a nuestra oficina a travs de MyChart de [facility] o por telfono llamando al [phone] y presione la opcin 4.  

## 2021-11-21 ENCOUNTER — Encounter: Payer: Self-pay | Admitting: Gastroenterology

## 2021-11-22 ENCOUNTER — Ambulatory Visit
Admission: RE | Admit: 2021-11-22 | Discharge: 2021-11-22 | Disposition: A | Discharge status: Home / Self Care | Payer: Medicare PPO | Source: Ambulatory Visit | Attending: Gastroenterology | Admitting: Gastroenterology

## 2021-11-22 ENCOUNTER — Encounter: Payer: Self-pay | Admitting: Gastroenterology

## 2021-11-22 ENCOUNTER — Ambulatory Visit: Payer: Medicare PPO | Admitting: Certified Registered Nurse Anesthetist

## 2021-11-22 ENCOUNTER — Encounter
Admission: RE | Disposition: A | Discharge status: Home / Self Care | Payer: Self-pay | Source: Ambulatory Visit | Attending: Gastroenterology

## 2021-11-22 DIAGNOSIS — K7469 Other cirrhosis of liver: Secondary | ICD-10-CM

## 2021-11-22 DIAGNOSIS — K449 Diaphragmatic hernia without obstruction or gangrene: Secondary | ICD-10-CM | POA: Insufficient documentation

## 2021-11-22 DIAGNOSIS — I10 Essential (primary) hypertension: Secondary | ICD-10-CM | POA: Insufficient documentation

## 2021-11-22 DIAGNOSIS — K222 Esophageal obstruction: Secondary | ICD-10-CM | POA: Insufficient documentation

## 2021-11-22 DIAGNOSIS — Z79899 Other long term (current) drug therapy: Secondary | ICD-10-CM | POA: Diagnosis not present

## 2021-11-22 DIAGNOSIS — K7460 Unspecified cirrhosis of liver: Secondary | ICD-10-CM | POA: Insufficient documentation

## 2021-11-22 DIAGNOSIS — G4730 Sleep apnea, unspecified: Secondary | ICD-10-CM | POA: Diagnosis not present

## 2021-11-22 DIAGNOSIS — I4891 Unspecified atrial fibrillation: Secondary | ICD-10-CM | POA: Diagnosis not present

## 2021-11-22 DIAGNOSIS — Z8249 Family history of ischemic heart disease and other diseases of the circulatory system: Secondary | ICD-10-CM | POA: Diagnosis not present

## 2021-11-22 HISTORY — PX: ESOPHAGOGASTRODUODENOSCOPY (EGD) WITH PROPOFOL: SHX5813

## 2021-11-22 HISTORY — DX: Cardiac arrhythmia, unspecified: I49.9

## 2021-11-22 SURGERY — ESOPHAGOGASTRODUODENOSCOPY (EGD) WITH PROPOFOL
Anesthesia: General

## 2021-11-22 MED ORDER — PROPOFOL 500 MG/50ML IV EMUL
INTRAVENOUS | Status: DC | PRN
  Administered 2021-11-22: 150 ug/kg/min via INTRAVENOUS

## 2021-11-22 MED ORDER — PROPOFOL 10 MG/ML IV BOLUS
INTRAVENOUS | Status: AC
  Filled 2021-11-22: qty 20

## 2021-11-22 MED ORDER — PROPOFOL 10 MG/ML IV BOLUS
INTRAVENOUS | Status: DC | PRN
  Administered 2021-11-22: 60 mg via INTRAVENOUS

## 2021-11-22 MED ORDER — PROPOFOL 1000 MG/100ML IV EMUL
INTRAVENOUS | Status: AC
  Filled 2021-11-22: qty 100

## 2021-11-22 MED ORDER — LIDOCAINE HCL (CARDIAC) PF 100 MG/5ML IV SOSY
PREFILLED_SYRINGE | INTRAVENOUS | Status: DC | PRN
  Administered 2021-11-22: 100 mg via INTRAVENOUS

## 2021-11-22 MED ORDER — SODIUM CHLORIDE 0.9 % IV SOLN: INTRAVENOUS | Status: DC

## 2021-11-22 NOTE — Op Note (Signed)
[facility] Gastroenterology Patient Name: Randy Montgomery Procedure Date: 11/22/2021 9:29 AM MRN: [id number] Account #: [id number] Date of Birth: 10-30-1945 Admit Type: Outpatient Age: 76 Room: [facility] ENDO ROOM 2 Gender: Male Note Status: Finalized Instrument Name: Upper Endoscope [id number] Procedure:             Upper GI endoscopy Indications:           Cirrhosis rule out esophageal varices Providers:             Jonathon Bellows MD, MD Medicines:             Monitored Anesthesia Care Complications:         No immediate complications. Procedure:             Pre-Anesthesia Assessment:                        - Prior to the procedure, a History and Physical was                         performed, and patient medications, allergies and                         sensitivities were reviewed. The patient's tolerance                         of previous anesthesia was reviewed.                        - The risks and benefits of the procedure and the                         sedation options and risks were discussed with the                         patient. All questions were answered and informed                         consent was obtained.                        - ASA Grade Assessment: II - A patient with mild                         systemic disease.                        After obtaining informed consent, the endoscope was                         passed under direct vision. Throughout the procedure,                         the patient's blood pressure, pulse, and oxygen                         saturations were monitored continuously. The Endoscope                         was introduced through the mouth, and advanced to the  third part of duodenum. The upper GI endoscopy was                         accomplished without difficulty. The patient tolerated                         the procedure well. Findings:      A mild Schatzki ring was found at the  gastroesophageal junction.      A small hiatal hernia was present.      The cardia and gastric fundus were normal on retroflexion.      The examined duodenum was normal. Impression:            - Mild Schatzki ring.                        - Small hiatal hernia.                        - Normal examined duodenum.                        - No specimens collected. Recommendation:        - Discharge patient to home (with escort).                        - Resume previous diet.                        - Continue present medications.                        - Repeat upper endoscopy in 3 years for surveillance. Procedure Code(s):     --- Professional ---                        [id number], Esophagogastroduodenoscopy, flexible,                         transoral; diagnostic, including collection of                         specimen(s) by brushing or washing, when performed                         (separate procedure) Diagnosis Code(s):     --- Professional ---                        K22.2, Esophageal obstruction                        K44.9, Diaphragmatic hernia without obstruction or                         gangrene                        K74.60, Unspecified cirrhosis of liver CPT copyright 2022 American Medical Association. All rights reserved. The codes documented in this report are preliminary and upon coder review may  be revised to meet current compliance requirements. Jonathon Bellows, MD Jonathon Bellows MD, MD 11/22/2021 9:38:20 AM This report has been signed electronically.  Number of Addenda: 0 Note Initiated On: 11/22/2021 9:29 AM Estimated Blood Loss:  Estimated blood loss: none.      [facility]

## 2021-11-22 NOTE — Anesthesia Postprocedure Evaluation (Signed)
Anesthesia Post Note  Patient: Randy Montgomery  Procedure(s) Performed: ESOPHAGOGASTRODUODENOSCOPY (EGD) WITH PROPOFOL  Patient location during evaluation: PACU Anesthesia Type: General Level of consciousness: awake and alert Pain management: pain level controlled Vital Signs Assessment: post-procedure vital signs reviewed and stable Respiratory status: spontaneous breathing and nonlabored ventilation Cardiovascular status: stable Anesthetic complications: no  No notable events documented.   Last Vitals:  Vitals:   11/22/21 0951 11/22/21 1001  BP: 115/72 132/73  Pulse: 60 (!) 42  Resp: (!) 22 15  Temp:    SpO2: 97% 97%    Last Pain:  Vitals:   11/22/21 1001  TempSrc:   PainSc: 0-No pain                 VAN STAVEREN,Shevon Sian

## 2021-11-22 NOTE — H&P (Signed)
Jonathon Bellows, MD [address], [place], [place], [place], [address] [address], [place], [place], [place], [address] Phone: [phone]  Fax: [phone]  Primary Care Physician:  Lauree Chandler, NP   Pre-Procedure History & Physical: HPI:  Randy Montgomery is a 76 y.o. male is here for an endoscopy    Past Medical History:  Diagnosis Date   Atrial fibrillation [facility])    Per [place] new patient packet   BPH (benign prostatic hyperplasia)    Dysrhythmia    Elevated PSA    Enlarged prostate    High cholesterol    Hx of colonoscopy 2016   Per [place] new patient packet   Hypertension    Sleep apnea    Per [place] new patient packet   Slow urinary stream     Past Surgical History:  Procedure Laterality Date   COLONOSCOPY  07/03/2016   COLONOSCOPY WITH PROPOFOL N/A 10/06/2020   Procedure: COLONOSCOPY WITH PROPOFOL;  Surgeon: Jonathon Bellows, MD;  Location: [facility] ENDOSCOPY;  Service: Gastroenterology;  Laterality: N/A;   HERNIA REPAIR Right 2010   inguinal hernia    PROSTATE BIOPSY     TONSILLECTOMY      Prior to Admission medications   Medication Sig Start Date End Date Taking? Authorizing Provider  metoprolol succinate (TOPROL-XL) 25 MG 24 hr tablet TAKE ONE TABLET BY MOUTH EVERY DAY 06/13/21  Yes Lauree Chandler, NP  furosemide (LASIX) 20 MG tablet TAKE 1 TABLET BY MOUTH DAILY. 11/04/21   Lauree Chandler, NP  polyethylene glycol (MIRALAX / GLYCOLAX) 17 g packet Take 17 g by mouth as needed.    [provider]  pravastatin (PRAVACHOL) 20 MG tablet TAKE ONE TABLET BY MOUTH EVERY DAY 06/27/21   Lauree Chandler, NP  tamsulosin (FLOMAX) 0.4 MG CAPS capsule Take 0.8 mg by mouth daily.    [provider]  tizanidine (ZANAFLEX) 2 MG capsule Take 1 capsule (2 mg total) by mouth 3 (three) times daily as needed for muscle spasms. 03/23/21   Lauree Chandler, NP  UNABLE TO FIND new full-face (Large) cushion masks, headgear and tubing for my ResMed AirSense 10.  10/25/21   Eubanks, Carlos American, NP  XARELTO 20 MG TABS tablet TAKE ONE TABLET BY MOUTH EVERY DAY WITH EVENING MEAL 06/27/21   Lauree Chandler, NP    Allergies as of 10/31/2021   (No Known Allergies)    Family History  Problem Relation Age of Onset   Aneurysm Father    Heart attack Father    Heart disease Father     Social History   Socioeconomic History   Marital status: Married    Spouse name: Not on file   Number of children: Not on file   Years of education: Not on file   Highest education level: Not on file  Occupational History   Not on file  Tobacco Use   Smoking status: Never   Smokeless tobacco: Never  Vaping Use   Vaping Use: Never used  Substance and Sexual Activity   Alcohol use: Yes    Alcohol/week: 6.0 standard drinks of alcohol    Types: 6 Standard drinks or equivalent per week    Comment: burbon 1 drinks a night   Drug use: Never   Sexual activity: Not on file  Other Topics Concern   Not on file  Social History Narrative   Diet      Do you drink/eat things with caffeine Yes  Marital Status Married  What year were you married? 1968      Do you live in a house, apartment, assisted living, condo, trailer, etc.? Assisted living      Is it one or more stories? 1      How many persons live in your home? 2         Do you have any pets in your home?(please list): No      Highest level of education completed: BA, [employer] 1967      Current or past profession: Civil engineer, contracting      Do you exercise?: Moderately  Type and how often: Wak, use wellnes equipment 2-3 times a week      Living Will?      DNR form? Yes    If not, do you wish to discuss one?       POA/HPOA forms? Yes      Difficulty bathing or dressing yourself? Patient did not answer these questions on new patient packet      Difficulty preparing food or eating?      Difficulty managing medications?      Difficulty managing your finances?      Difficulty affording your medications?                      Social Determinants of Health   Financial Resource Strain: Not on file  Food Insecurity: Not on file  Transportation Needs: Not on file  Physical Activity: Not on file  Stress: Not on file  Social Connections: Not on file  Intimate Partner Violence: Not on file    Review of Systems: See HPI, otherwise negative ROS  Physical Exam: BP (!) 158/82   Pulse (!) 56   Temp (!) 97.3 F (36.3 C) (Temporal)   Resp 16   Ht '5\' 6"'$  (1.676 m)   Wt 76.2 kg   BMI 27.12 kg/m  General:   Alert,  pleasant and cooperative in NAD Head:  Normocephalic and atraumatic. Neck:  Supple; no masses or thyromegaly. Lungs:  Clear throughout to auscultation, normal respiratory effort.    Heart:  +S1, +S2, Regular rate and rhythm, No edema. Abdomen:  Soft, nontender and nondistended. Normal bowel sounds, without guarding, and without rebound.   Neurologic:  Alert and  oriented x4;  grossly normal neurologically.  Impression/Plan: Randy Montgomery is here for an endoscopy  to be performed for  evaluation of esophageal varices    Risks, benefits, limitations, and alternatives regarding endoscopy have been reviewed with the patient.  Questions have been answered.  All parties agreeable.   Jonathon Bellows, MD  11/22/2021, 9:18 AM

## 2021-11-22 NOTE — Anesthesia Preprocedure Evaluation (Signed)
Anesthesia Evaluation  Patient identified by MRN, date of birth, ID band Patient awake    Reviewed: Allergy & Precautions, NPO status , Patient's Chart, lab work & pertinent test results  Airway Mallampati: II  TM Distance: >3 FB Neck ROM: Full    Dental  (+) Teeth Intact   Pulmonary sleep apnea    breath sounds clear to auscultation       Cardiovascular Exercise Tolerance: Good hypertension, Pt. on medications + dysrhythmias Atrial Fibrillation  Rhythm:Irregular     Neuro/Psych negative neurological ROS  negative psych ROS   GI/Hepatic negative GI ROS, Neg liver ROS,,,  Endo/Other  negative endocrine ROS    Renal/GU negative Renal ROS     Musculoskeletal   Abdominal Normal abdominal exam  (+)   Peds negative pediatric ROS (+)  Hematology negative hematology ROS (+)   Anesthesia Other Findings   Reproductive/Obstetrics                             Anesthesia Physical Anesthesia Plan  ASA: 3  Anesthesia Plan: General   Post-op Pain Management:    Induction: Intravenous  PONV Risk Score and Plan:   Airway Management Planned: Natural Airway  Additional Equipment:   Intra-op Plan:   Post-operative Plan:   Informed Consent: I have reviewed the patients History and Physical, chart, labs and discussed the procedure including the risks, benefits and alternatives for the proposed anesthesia with the patient or authorized representative who has indicated his/her understanding and acceptance.       Plan Discussed with: CRNA and Surgeon  Anesthesia Plan Comments:        Anesthesia Quick Evaluation

## 2021-11-22 NOTE — Transfer of Care (Signed)
Immediate Anesthesia Transfer of Care Note  Patient: Randy Montgomery  Procedure(s) Performed: ESOPHAGOGASTRODUODENOSCOPY (EGD) WITH PROPOFOL  Patient Location: Endoscopy Unit  Anesthesia Type:General  Level of Consciousness: drowsy  Airway & Oxygen Therapy: Patient Spontanous Breathing  Post-op Assessment: Report given to RN and Post -op Vital signs reviewed and stable  Post vital signs: Reviewed and stable  Last Vitals:  Vitals Value Taken Time  BP 108/67 11/22/21 0942  Temp 36.1 C 11/22/21 0941  Pulse 63 11/22/21 0942  Resp 17 11/22/21 0942  SpO2 97 % 11/22/21 0942  Vitals shown include unvalidated device data.  Last Pain:  Vitals:   11/22/21 0941  TempSrc: Temporal  PainSc: Asleep         Complications: No notable events documented.

## 2021-11-22 NOTE — Anesthesia Procedure Notes (Signed)
Date/Time: 11/22/2021 9:43 AM  Performed by: Lily Peer, Libi Corso, CRNAPre-anesthesia Checklist: Patient identified, Emergency Drugs available, Suction available, Patient being monitored and Timeout performed Patient Re-evaluated:Patient Re-evaluated prior to induction Oxygen Delivery Method: Simple face mask Induction Type: IV induction

## 2021-11-23 ENCOUNTER — Encounter: Payer: Self-pay | Admitting: Gastroenterology

## 2021-12-06 ENCOUNTER — Encounter: Payer: Self-pay | Admitting: Dermatology

## 2021-12-12 ENCOUNTER — Other Ambulatory Visit: Payer: Self-pay | Admitting: Nurse Practitioner

## 2021-12-15 ENCOUNTER — Encounter: Payer: Self-pay | Admitting: Nurse Practitioner

## 2021-12-15 ENCOUNTER — Ambulatory Visit: Payer: Medicare PPO | Admitting: Nurse Practitioner

## 2021-12-15 VITALS — BP 124/70 | HR 51 | Temp 98.0°F | Ht 66.0 in | Wt 164.0 lb

## 2021-12-15 DIAGNOSIS — I480 Paroxysmal atrial fibrillation: Secondary | ICD-10-CM | POA: Diagnosis not present

## 2021-12-15 DIAGNOSIS — K7469 Other cirrhosis of liver: Secondary | ICD-10-CM

## 2021-12-15 DIAGNOSIS — R350 Frequency of micturition: Secondary | ICD-10-CM

## 2021-12-15 DIAGNOSIS — D6869 Other thrombophilia: Secondary | ICD-10-CM

## 2021-12-15 DIAGNOSIS — Z66 Do not resuscitate: Secondary | ICD-10-CM | POA: Diagnosis not present

## 2021-12-15 DIAGNOSIS — N401 Enlarged prostate with lower urinary tract symptoms: Secondary | ICD-10-CM

## 2021-12-15 DIAGNOSIS — E782 Mixed hyperlipidemia: Secondary | ICD-10-CM

## 2021-12-15 DIAGNOSIS — I872 Venous insufficiency (chronic) (peripheral): Secondary | ICD-10-CM

## 2021-12-15 NOTE — Progress Notes (Signed)
Careteam: Patient Care Team: Lauree Chandler, NP as PCP - General (Geriatric Medicine)  Advanced Directive information Does Patient Have a Medical Advance Directive?: Yes, Type of Advance Directive: [place];Out of facility DNR (pink MOST or yellow form), Pre-existing out of facility DNR order (yellow form or pink MOST form): Yellow form placed in chart (order not valid for inpatient use), Does patient want to make changes to medical advance directive?: No - Patient declined  No Known Allergies  Chief Complaint  Patient presents with   Medical Management of [facility]- 6 month follow-up. Discuss need for td/tdap or post pone if patient refuses. Discuss lasix.      HPI: Patient is a 76 y.o. male seen in today at the [facility] for routine follow up.   Dr Vicente Males GI in November and under went EGD. No esophageal varices.  He is working on stopping drinking.   Reports good appetite, eating a little less, has lost some weight. He is having to cook due to wife's medical issues. She is gluten intolerant so he is doing more cooking and eating in.   A fib- rate controlled  LE edema- continues to take lasix daily to control LE.   Continues on CPAP.     Review of Systems:  Review of Systems  Constitutional:  Negative for chills, fever and weight loss.  HENT:  Negative for tinnitus.   Respiratory:  Negative for cough, sputum production and shortness of breath.   Cardiovascular:  Negative for chest pain, palpitations and leg swelling.  Gastrointestinal:  Negative for abdominal pain, constipation, diarrhea and heartburn.  Genitourinary:  Negative for dysuria, frequency and urgency.  Musculoskeletal:  Negative for back pain, falls, joint pain and myalgias.  Skin: Negative.   Neurological:  Negative for dizziness and headaches.  Psychiatric/Behavioral:  Negative for depression and memory loss. The patient does not have insomnia.      Past Medical History:  Diagnosis Date   Atrial fibrillation [facility])    Per [place] new patient packet   BPH (benign prostatic hyperplasia)    Dysrhythmia    Elevated PSA    Enlarged prostate    High cholesterol    Hx of colonoscopy 2016   Per [place] new patient packet   Hypertension    Sleep apnea    Per [place] new patient packet   Slow urinary stream    Past Surgical History:  Procedure Laterality Date   COLONOSCOPY  07/03/2016   COLONOSCOPY WITH PROPOFOL N/A 10/06/2020   Procedure: COLONOSCOPY WITH PROPOFOL;  Surgeon: Jonathon Bellows, MD;  Location: [facility] ENDOSCOPY;  Service: Gastroenterology;  Laterality: N/A;   ESOPHAGOGASTRODUODENOSCOPY (EGD) WITH PROPOFOL N/A 11/22/2021   Procedure: ESOPHAGOGASTRODUODENOSCOPY (EGD) WITH PROPOFOL;  Surgeon: Jonathon Bellows, MD;  Location: [facility] ENDOSCOPY;  Service: Gastroenterology;  Laterality: N/A;   HERNIA REPAIR Right 2010   inguinal hernia    PROSTATE BIOPSY     TONSILLECTOMY     Social History:   reports that he has never smoked. He has never used smokeless tobacco. He reports current alcohol use of about 6.0 standard drinks of alcohol per week. He reports that he does not use drugs.  Family History  Problem Relation Age of Onset   Aneurysm Father    Heart attack Father    Heart disease Father     Medications: Patient's Medications  New Prescriptions   No medications on file  Previous Medications   FUROSEMIDE (  LASIX) 20 MG TABLET    TAKE 1 TABLET BY MOUTH DAILY.   METOPROLOL SUCCINATE (TOPROL-XL) 25 MG 24 HR TABLET    TAKE ONE TABLET BY MOUTH EVERY DAY   POLYETHYLENE GLYCOL (MIRALAX / GLYCOLAX) 17 G PACKET    Take 17 g by mouth as needed.   PRAVASTATIN (PRAVACHOL) 20 MG TABLET    TAKE ONE TABLET BY MOUTH EVERY DAY   TAMSULOSIN (FLOMAX) 0.4 MG CAPS CAPSULE    Take 0.8 mg by mouth daily.   TIZANIDINE (ZANAFLEX) 2 MG CAPSULE    Take 1 capsule (2 mg total) by mouth 3 (three) times daily as needed for muscle spasms.   XARELTO 20 MG TABS TABLET     TAKE ONE TABLET BY MOUTH EVERY DAY WITH EVENING MEAL  Modified Medications   No medications on file  Discontinued Medications   UNABLE TO FIND    new full-face (Large) cushion masks, headgear and tubing for my ResMed AirSense 10.    Physical Exam:  Vitals:   12/15/21 1016  BP: 124/70  Pulse: (!) 51  Temp: 98 F (36.7 C)  TempSrc: Temporal  SpO2: 95%  Weight: 164 lb (74.4 kg)  Height: '5\' 6"'$  (1.676 m)   Body mass index is 26.47 kg/m. Wt Readings from Last 3 Encounters:  12/15/21 164 lb (74.4 kg)  11/22/21 168 lb (76.2 kg)  10/31/21 169 lb 4 oz (76.8 kg)    Physical Exam Constitutional:      General: He is not in acute distress.    Appearance: He is well-developed. He is not diaphoretic.  HENT:     Head: Normocephalic and atraumatic.     Right Ear: External ear normal.     Left Ear: External ear normal.     Mouth/Throat:     Pharynx: No oropharyngeal exudate.  Eyes:     Conjunctiva/sclera: Conjunctivae normal.     Pupils: Pupils are equal, round, and reactive to light.  Cardiovascular:     Rate and Rhythm: Normal rate and regular rhythm.     Heart sounds: Normal heart sounds.  Pulmonary:     Effort: Pulmonary effort is normal.     Breath sounds: Normal breath sounds.  Abdominal:     General: Bowel sounds are normal.     Palpations: Abdomen is soft.  Musculoskeletal:        General: No tenderness.     Cervical back: Normal range of motion and neck supple.     Right lower leg: No edema.     Left lower leg: No edema.  Skin:    General: Skin is warm and dry.  Neurological:     Mental Status: He is alert and oriented to person, place, and time. Mental status is at baseline.  Psychiatric:        Mood and Affect: Mood normal.    Labs reviewed: Basic Metabolic Panel: Recent Labs    06/13/21 0000 08/25/21 0000  NA 138 138  K 4.2 4.0  CL 102 101  CO2 30* 30*  BUN 17 23*  CREATININE 0.9 0.9  CALCIUM 9.6 9.7   Liver Function Tests: Recent Labs     06/13/21 0000 06/21/21 0000  AST 24 22  ALT 22 20  ALKPHOS 131* 110  ALBUMIN 4.3 4.3   No results for input(s): "LIPASE", "AMYLASE" in the last 8760 hours. No results for input(s): "AMMONIA" in the last 8760 hours. CBC: Recent Labs    06/13/21 0000  WBC 5.9  NEUTROABS 3,475.00  HGB 13.9  HCT 40*  PLT 151   Lipid Panel: Recent Labs    06/13/21 0000  CHOL 149  HDL 79*  LDLCALC 57  TRIG 53   TSH: No results for input(s): "TSH" in the last 8760 hours. A1C: No results found for: "HGBA1C"   Assessment/Plan  1. Paroxysmal atrial fibrillation (HCC) -rate controlled, continues on metoprolol   2. Acquired thrombophilia ([place]) -due to a fib, continues on xarelto.   3. Other cirrhosis of liver ([place]) -followed by GI, recommended cessation of ETOH  4. Do not resuscitate - Do not attempt resuscitation (DNR)  5. Edema of both lower extremities due to peripheral venous insufficiency -stable on lasix  6. Mixed hyperlipidemia Cholesterol stable on last labs, continue on dietary modifications with pravachol.   7. Benign prostatic hyperplasia with urinary frequency -stable, continues on Flomax    Next appt: 6 months.  Carlos American. [place], [facility] [phone]

## 2021-12-15 NOTE — Patient Instructions (Signed)
To come to [facility] on Monday at 7:30 for lab

## 2021-12-16 ENCOUNTER — Encounter: Payer: Self-pay | Admitting: Nurse Practitioner

## 2021-12-16 ENCOUNTER — Ambulatory Visit: Payer: Medicare PPO | Admitting: Nurse Practitioner

## 2021-12-19 DIAGNOSIS — D6869 Other thrombophilia: Secondary | ICD-10-CM | POA: Diagnosis not present

## 2021-12-19 DIAGNOSIS — K7469 Other cirrhosis of liver: Secondary | ICD-10-CM | POA: Diagnosis not present

## 2021-12-19 DIAGNOSIS — I480 Paroxysmal atrial fibrillation: Secondary | ICD-10-CM | POA: Diagnosis not present

## 2021-12-19 LAB — CBC AND DIFFERENTIAL
HCT: 38 — AB (ref 41–53)
Hemoglobin: 13.2 — AB (ref 13.5–17.5)
WBC: 5.6

## 2021-12-19 LAB — BASIC METABOLIC PANEL
BUN: 26 — AB (ref 4–21)
CO2: 31 — AB (ref 13–22)
Chloride: 103 (ref 99–108)
Creatinine: 1 (ref 0.6–1.3)
Glucose: 91
Sodium: 139 (ref 137–147)

## 2021-12-19 LAB — COMPREHENSIVE METABOLIC PANEL
Albumin: 4.4 (ref 3.5–5.0)
Calcium: 9.5 (ref 8.7–10.7)
Globulin: 2.4
eGFR: 82

## 2021-12-19 LAB — HEPATIC FUNCTION PANEL
ALT: 21 U/L (ref 10–40)
AST: 25 (ref 14–40)
Bilirubin, Total: 2.9

## 2021-12-19 LAB — CBC: RBC: 3.85 — AB (ref 3.87–5.11)

## 2021-12-21 ENCOUNTER — Encounter: Payer: Self-pay | Admitting: Nurse Practitioner

## 2021-12-23 ENCOUNTER — Telehealth: Payer: Medicare PPO | Admitting: Nurse Practitioner

## 2021-12-23 NOTE — Telephone Encounter (Signed)
I called patient and is aware of lab results.

## 2021-12-23 NOTE — Telephone Encounter (Signed)
Please call patient and let him know Blood counts, electrolytes, glucose, liver and kidney function are all stable at this time. Labs in media.

## 2021-12-26 ENCOUNTER — Other Ambulatory Visit: Payer: Self-pay | Admitting: Nurse Practitioner

## 2022-01-16 ENCOUNTER — Telehealth: Payer: Self-pay | Admitting: Gastroenterology

## 2022-01-16 ENCOUNTER — Encounter: Payer: Self-pay | Admitting: Gastroenterology

## 2022-01-16 ENCOUNTER — Encounter: Payer: Self-pay | Admitting: Nurse Practitioner

## 2022-01-16 NOTE — Telephone Encounter (Signed)
Patient called his pharmacy about his Hep A-B and he states [facility] not [facility] drug. He was calling to see if he can get it done here. Requesting Dr Georgeann Oppenheim CMA give him a call.

## 2022-01-17 NOTE — Telephone Encounter (Signed)
Patient sent a mychart message and receive the vaccine at the pharmacy yesterday

## 2022-01-17 NOTE — Telephone Encounter (Signed)
Immunization is documented in chart.

## 2022-02-06 ENCOUNTER — Ambulatory Visit: Payer: Medicare PPO | Admitting: Gastroenterology

## 2022-02-06 ENCOUNTER — Encounter: Payer: Self-pay | Admitting: Gastroenterology

## 2022-02-06 VITALS — BP 139/75 | HR 53 | Temp 98.8°F | Ht 66.0 in | Wt 167.4 lb

## 2022-02-06 DIAGNOSIS — K7469 Other cirrhosis of liver: Secondary | ICD-10-CM | POA: Diagnosis not present

## 2022-02-06 NOTE — Progress Notes (Unsigned)
Jonathon Bellows MD, MRCP(U.K) [address]  [place]  [place],  [address]  Main: [phone]  Fax: [phone]   Primary Care Physician: Lauree Chandler, NP  Primary Gastroenterologist:  Dr. Jonathon Bellows   Chief Complaint  Patient presents with   Cirrhosis    HPI: Randy Montgomery is a 77 y.o. male   Summary of history :  History referred and in in October 2023 and was found to have features of cirrhosis with perihepatic ascites on a recent ultrasound.He denies any illegal drug use. No doctors. Normal to treat service. He does attest that he had drank in excess for over 20 to 30 years on a daily basis has recently cut down. No family history of liver disease.   Interval history   10/31/2021-02/06/2022  10/31/2021: AFP normal not immune to hepatitis A and B recommended vaccine otherwise viral hepatitis and autoimmune markers were negative 11/23/2022: EGD: Hiatal hernia seen no evidence of esophageal varices  He is doing well no come plaints still trying to completely stop drinking alcohol no issues with sleep.  Current Outpatient Medications  Medication Sig Dispense Refill   furosemide (LASIX) 20 MG tablet TAKE 1 TABLET BY MOUTH DAILY. 30 tablet 3   metoprolol succinate (TOPROL-XL) 25 MG 24 hr tablet TAKE ONE TABLET BY MOUTH EVERY DAY 90 tablet 1   polyethylene glycol (MIRALAX / GLYCOLAX) 17 g packet Take 17 g by mouth as needed.     pravastatin (PRAVACHOL) 20 MG tablet TAKE ONE TABLET BY MOUTH EVERY DAY 90 tablet 1   tamsulosin (FLOMAX) 0.4 MG CAPS capsule Take 0.8 mg by mouth daily.     tizanidine (ZANAFLEX) 2 MG capsule Take 1 capsule (2 mg total) by mouth 3 (three) times daily as needed for muscle spasms. 20 capsule 1   XARELTO 20 MG TABS tablet TAKE ONE TABLET BY MOUTH EVERY DAY WITH EVENING MEAL 90 tablet 1   No current facility-administered medications for this visit.    Allergies as of 02/06/2022   (No Known Allergies)    ROS:  General: Negative for  anorexia, weight loss, fever, chills, fatigue, weakness. ENT: Negative for hoarseness, difficulty swallowing , nasal congestion. CV: Negative for chest pain, angina, palpitations, dyspnea on exertion, peripheral edema.  Respiratory: Negative for dyspnea at rest, dyspnea on exertion, cough, sputum, wheezing.  GI: See history of present illness. GU:  Negative for dysuria, hematuria, urinary incontinence, urinary frequency, nocturnal urination.  Endo: Negative for unusual weight change.    Physical Examination:   BP 139/75   Pulse (!) 53   Temp 98.8 F (37.1 C) (Oral)   Ht '5\' 6"'$  (1.676 m)   Wt 167 lb 6 oz (75.9 kg)   BMI 27.02 kg/m   General: Well-nourished, well-developed in no acute distress.  Eyes: No icterus. Conjunctivae pink. Mouth: Oropharyngeal mucosa moist and pink , no lesions erythema or exudate. Lungs: Clear to auscultation bilaterally. Non-labored. Heart: Regular rate and rhythm, no murmurs rubs or gallops.  Abdomen: Bowel sounds are normal, nontender, nondistended, no hepatosplenomegaly or masses, no abdominal bruits or hernia , no rebound or guarding.   Extremities: No lower extremity edema. No clubbing or deformities. Neuro: Alert and oriented x 3.  Grossly intact. Skin: Warm and dry, no jaundice.   Psych: Alert and cooperative, normal mood and affect.   Imaging Studies: No results found.  Assessment and Plan:   Randy Montgomery is a 77 y.o. y/o male here to follow-up for liver cirrhosis.  Based on history very likely secondary to chronic excess alcohol consumption over the years.  Autoimmune and viral hepatitis workup has been negative.  Compensated  Plan  1.  Needs hepatitis A and B vaccine 2.  Screen for [facility] with AFP and ultrasound in May 2024 3.  Screen for esophageal varices in January 2027 4.  Advised to stop all alcohol consumption 5.  Labs to calculate MELD score in April    Dr Jonathon Bellows  MD,MRCP [facility]) Follow up in 6 months

## 2022-02-06 NOTE — Patient Instructions (Addendum)
Please come to the office for labs only around May 20, 2022. No need for an appointment.  Lab Hours: Monday and Thursday: 1:30 PM-4:30 PM Tuesday and Wednesday: 8:30 AM-4:30 PM  We will call you to schedule you a follow up appointment in 6 months once we have our schedule open.

## 2022-02-09 NOTE — Addendum Note (Signed)
Addended by: Wayna Chalet on: 02/09/2022 02:13 PM   Modules accepted: Orders

## 2022-03-07 DIAGNOSIS — H35412 Lattice degeneration of retina, left eye: Secondary | ICD-10-CM | POA: Diagnosis not present

## 2022-03-07 DIAGNOSIS — H43811 Vitreous degeneration, right eye: Secondary | ICD-10-CM | POA: Diagnosis not present

## 2022-03-07 DIAGNOSIS — H25093 Other age-related incipient cataract, bilateral: Secondary | ICD-10-CM | POA: Diagnosis not present

## 2022-03-07 DIAGNOSIS — H3321 Serous retinal detachment, right eye: Secondary | ICD-10-CM | POA: Diagnosis not present

## 2022-03-08 DIAGNOSIS — G4733 Obstructive sleep apnea (adult) (pediatric): Secondary | ICD-10-CM | POA: Diagnosis not present

## 2022-03-14 ENCOUNTER — Other Ambulatory Visit: Payer: Self-pay | Admitting: Nurse Practitioner

## 2022-03-22 ENCOUNTER — Other Ambulatory Visit: Payer: Self-pay | Admitting: Nurse Practitioner

## 2022-03-22 DIAGNOSIS — R600 Localized edema: Secondary | ICD-10-CM

## 2022-04-15 ENCOUNTER — Encounter: Payer: Self-pay | Admitting: Nurse Practitioner

## 2022-04-17 NOTE — Telephone Encounter (Signed)
Please abstract covid booster.

## 2022-04-21 DIAGNOSIS — K7469 Other cirrhosis of liver: Secondary | ICD-10-CM | POA: Diagnosis not present

## 2022-04-22 LAB — COMPREHENSIVE METABOLIC PANEL
ALT: 23 IU/L (ref 0–44)
AST: 25 IU/L (ref 0–40)
Albumin/Globulin Ratio: 1.8 (ref 1.2–2.2)
Albumin: 4.4 g/dL (ref 3.8–4.8)
Alkaline Phosphatase: 173 IU/L — ABNORMAL HIGH (ref 44–121)
BUN/Creatinine Ratio: 20 (ref 10–24)
BUN: 19 mg/dL (ref 8–27)
Bilirubin Total: 2.4 mg/dL — ABNORMAL HIGH (ref 0.0–1.2)
CO2: 25 mmol/L (ref 20–29)
Calcium: 10.1 mg/dL (ref 8.6–10.2)
Chloride: 99 mmol/L (ref 96–106)
Creatinine, Ser: 0.96 mg/dL (ref 0.76–1.27)
Globulin, Total: 2.4 g/dL (ref 1.5–4.5)
Glucose: 125 mg/dL — ABNORMAL HIGH (ref 70–99)
Potassium: 4 mmol/L (ref 3.5–5.2)
Sodium: 139 mmol/L (ref 134–144)
Total Protein: 6.8 g/dL (ref 6.0–8.5)
eGFR: 80 mL/min/{1.73_m2} (ref >59)

## 2022-04-22 LAB — CBC WITH DIFFERENTIAL/PLATELET
Basophils Absolute: 0.1 10*3/uL (ref 0.0–0.2)
Basos: 1 %
EOS (ABSOLUTE): 0.1 10*3/uL (ref 0.0–0.4)
Eos: 2 %
Hematocrit: 40.4 % (ref 37.5–51.0)
Hemoglobin: 14.2 g/dL (ref 13.0–17.7)
Immature Grans (Abs): 0 10*3/uL (ref 0.0–0.1)
Immature Granulocytes: 0 %
Lymphocytes Absolute: 1.3 10*3/uL (ref 0.7–3.1)
Lymphs: 21 %
MCH: 35.3 pg — ABNORMAL HIGH (ref 26.6–33.0)
MCHC: 35.1 g/dL (ref 31.5–35.7)
MCV: 101 fL — ABNORMAL HIGH (ref 79–97)
Monocytes Absolute: 0.5 10*3/uL (ref 0.1–0.9)
Monocytes: 8 %
Neutrophils Absolute: 4.2 10*3/uL (ref 1.4–7.0)
Neutrophils: 68 %
Platelets: 238 10*3/uL (ref 150–450)
RBC: 4.02 x10E6/uL — ABNORMAL LOW (ref 4.14–5.80)
RDW: 11.9 % (ref 11.6–15.4)
WBC: 6.2 10*3/uL (ref 3.4–10.8)

## 2022-04-22 LAB — AFP TUMOR MARKER: AFP, Serum, Tumor Marker: 2.8 ng/mL (ref 0.0–8.4)

## 2022-04-22 LAB — PROTIME-INR
INR: 1.1 (ref 0.9–1.2)
Prothrombin Time: 12.2 s — ABNORMAL HIGH (ref 9.1–12.0)

## 2022-04-26 ENCOUNTER — Other Ambulatory Visit: Payer: Self-pay | Admitting: Nurse Practitioner

## 2022-05-05 ENCOUNTER — Ambulatory Visit
Admission: RE | Admit: 2022-05-05 | Discharge: 2022-05-05 | Disposition: A | Discharge status: Home / Self Care | Payer: Medicare PPO | Source: Ambulatory Visit | Attending: Gastroenterology | Admitting: Gastroenterology

## 2022-05-05 DIAGNOSIS — K7469 Other cirrhosis of liver: Secondary | ICD-10-CM | POA: Insufficient documentation

## 2022-05-05 DIAGNOSIS — K7460 Unspecified cirrhosis of liver: Secondary | ICD-10-CM | POA: Diagnosis not present

## 2022-05-05 DIAGNOSIS — K7689 Other specified diseases of liver: Secondary | ICD-10-CM | POA: Diagnosis not present

## 2022-05-19 ENCOUNTER — Encounter: Payer: Self-pay | Admitting: Gastroenterology

## 2022-05-22 ENCOUNTER — Other Ambulatory Visit: Payer: Self-pay

## 2022-05-22 DIAGNOSIS — K7469 Other cirrhosis of liver: Secondary | ICD-10-CM

## 2022-05-22 DIAGNOSIS — G4733 Obstructive sleep apnea (adult) (pediatric): Secondary | ICD-10-CM | POA: Diagnosis not present

## 2022-06-05 ENCOUNTER — Other Ambulatory Visit: Payer: Self-pay | Admitting: Nurse Practitioner

## 2022-06-20 ENCOUNTER — Ambulatory Visit: Payer: Medicare PPO | Admitting: Nurse Practitioner

## 2022-06-20 ENCOUNTER — Encounter: Payer: Self-pay | Admitting: Nurse Practitioner

## 2022-06-20 VITALS — BP 132/78 | HR 61 | Temp 98.2°F | Ht 66.0 in | Wt 177.0 lb

## 2022-06-20 DIAGNOSIS — M159 Polyosteoarthritis, unspecified: Secondary | ICD-10-CM | POA: Diagnosis not present

## 2022-06-20 DIAGNOSIS — E782 Mixed hyperlipidemia: Secondary | ICD-10-CM

## 2022-06-20 DIAGNOSIS — D6869 Other thrombophilia: Secondary | ICD-10-CM | POA: Diagnosis not present

## 2022-06-20 DIAGNOSIS — R350 Frequency of micturition: Secondary | ICD-10-CM | POA: Diagnosis not present

## 2022-06-20 DIAGNOSIS — I429 Cardiomyopathy, unspecified: Secondary | ICD-10-CM | POA: Diagnosis not present

## 2022-06-20 DIAGNOSIS — K7469 Other cirrhosis of liver: Secondary | ICD-10-CM | POA: Diagnosis not present

## 2022-06-20 DIAGNOSIS — I482 Chronic atrial fibrillation, unspecified: Secondary | ICD-10-CM

## 2022-06-20 DIAGNOSIS — N401 Enlarged prostate with lower urinary tract symptoms: Secondary | ICD-10-CM

## 2022-06-20 DIAGNOSIS — R21 Rash and other nonspecific skin eruption: Secondary | ICD-10-CM | POA: Diagnosis not present

## 2022-06-20 NOTE — Progress Notes (Signed)
Careteam: Patient Care Team: Sharon Seller, NP as PCP - General (Geriatric Medicine)  Advanced Directive information Does Patient Have a Medical Advance Directive?: Yes, Type of Advance Directive: Healthcare Power of [place];Out of facility DNR (pink MOST or yellow form);Living will, Does patient want to make changes to medical advance directive?: No - Patient declined  No Known Allergies  Chief Complaint  Patient presents with   Medical Management of Chronic Issues    Medical Management of Chronic Issues. 6 Month follow up     HPI: Patient is a 77 y.o. male seen in today at the [facility] for follow up.   He had follow up ultrasound which has been stable. Plan to recheck lab in 6 week.   Hyperlipidemia- continues on pravastatin  Having some itchy areas, red spots. Started treating with hydrocortisone and gold bond cream using twice daily for a while but then stopped, Now it has gotten a little better.   Having a lot of pain in his right hand, thumb. Right now pain is minimal but will have flares and has decreased strength.   Plans to get TDAP vaccine soon.   Bph- doing well on flomax 2 tablets at bedtime Has not needed to follow up with urologist.   A fib- rate controlled  No signs of bleeding on xarelto.   Review of Systems:  Review of Systems  Constitutional:  Negative for chills, fever and weight loss.  HENT:  Negative for tinnitus.   Respiratory:  Negative for cough, sputum production and shortness of breath.   Cardiovascular:  Negative for chest pain, palpitations and leg swelling.  Gastrointestinal:  Negative for abdominal pain, constipation, diarrhea and heartburn.  Genitourinary:  Negative for dysuria, frequency and urgency.  Musculoskeletal:  Negative for back pain, falls, joint pain and myalgias.  Skin:  Positive for rash.  Neurological:  Negative for dizziness and headaches.  Psychiatric/Behavioral:  Negative for depression and memory loss.  The patient does not have insomnia.     Past Medical History:  Diagnosis Date   Atrial fibrillation [facility])    Per [facility] new patient packet   BPH (benign prostatic hyperplasia)    Dysrhythmia    Elevated PSA    Enlarged prostate    High cholesterol    Hx of colonoscopy 2016   Per [facility] new patient packet   Hypertension    Sleep apnea    Per [facility] new patient packet   Slow urinary stream    Past Surgical History:  Procedure Laterality Date   COLONOSCOPY  07/03/2016   COLONOSCOPY WITH PROPOFOL N/A 10/06/2020   Procedure: COLONOSCOPY WITH PROPOFOL;  Surgeon: Wyline Mood, MD;  Location: [facility] ENDOSCOPY;  Service: Gastroenterology;  Laterality: N/A;   ESOPHAGOGASTRODUODENOSCOPY (EGD) WITH PROPOFOL N/A 11/22/2021   Procedure: ESOPHAGOGASTRODUODENOSCOPY (EGD) WITH PROPOFOL;  Surgeon: Wyline Mood, MD;  Location: [facility] ENDOSCOPY;  Service: Gastroenterology;  Laterality: N/A;   HERNIA REPAIR Right 2010   inguinal hernia    PROSTATE BIOPSY     TONSILLECTOMY     Social History:   reports that he has never smoked. He has never used smokeless tobacco. He reports current alcohol use of about 6.0 standard drinks of alcohol per week. He reports that he does not use drugs.  Family History  Problem Relation Age of Onset   Aneurysm Father    Heart attack Father    Heart disease Father     Medications: Patient's Medications  New Prescriptions   No medications  on file  Previous Medications   FUROSEMIDE (LASIX) 20 MG TABLET    TAKE 1 TABLET BY MOUTH DAILY.   METOPROLOL SUCCINATE (TOPROL-XL) 25 MG 24 HR TABLET    TAKE ONE TABLET BY MOUTH EVERY DAY   POLYETHYLENE GLYCOL (MIRALAX / GLYCOLAX) 17 G PACKET    Take 17 g by mouth as needed.   PRAVASTATIN (PRAVACHOL) 20 MG TABLET    TAKE ONE TABLET BY MOUTH EVERY DAY   TAMSULOSIN (FLOMAX) 0.4 MG CAPS CAPSULE    TAKE 2 CAPSULES AT BEDTIME   TIZANIDINE (ZANAFLEX) 2 MG CAPSULE    Take 1 capsule (2 mg total) by mouth 3 (three) times daily as needed for muscle  spasms.   XARELTO 20 MG TABS TABLET    TAKE ONE TABLET BY MOUTH EVERY DAY WITH EVENING MEAL  Modified Medications   No medications on file  Discontinued Medications   No medications on file    Physical Exam:  Vitals:   06/20/22 1010  BP: 132/78  Pulse: 61  Temp: 98.2 F (36.8 C)  SpO2: 97%  Weight: 177 lb (80.3 kg)  Height: 5\' 6"  (1.676 m)   Body mass index is 28.57 kg/m. Wt Readings from Last 3 Encounters:  06/20/22 177 lb (80.3 kg)  02/06/22 167 lb 6 oz (75.9 kg)  12/15/21 164 lb (74.4 kg)    Physical Exam Constitutional:      General: He is not in acute distress.    Appearance: He is well-developed. He is not diaphoretic.  HENT:     Head: Normocephalic and atraumatic.     Right Ear: External ear normal.     Left Ear: External ear normal.     Mouth/Throat:     Pharynx: No oropharyngeal exudate.  Eyes:     Conjunctiva/sclera: Conjunctivae normal.     Pupils: Pupils are equal, round, and reactive to light.  Cardiovascular:     Rate and Rhythm: Bradycardia present. Rhythm irregular.     Heart sounds: Normal heart sounds.  Pulmonary:     Effort: Pulmonary effort is normal.     Breath sounds: Normal breath sounds.  Abdominal:     General: Bowel sounds are normal.     Palpations: Abdomen is soft.  Musculoskeletal:        General: No tenderness.     Cervical back: Normal range of motion and neck supple.     Right lower leg: No edema.     Left lower leg: No edema.  Skin:    General: Skin is warm and dry.     Findings: Rash (diffuse faint rash to right side abdomen and into abdomen) present.  Neurological:     Mental Status: He is alert and oriented to person, place, and time.     Labs reviewed: Basic Metabolic Panel: Recent Labs    08/25/21 0000 12/19/21 0000 04/21/22 1046  NA 138 139 139  K 4.0  --  4.0  CL 101 103 99  CO2 30* 31* 25  GLUCOSE  --   --  125*  BUN 23* 26* 19  CREATININE 0.9 1.0 0.96  CALCIUM 9.7 9.5 10.1   Liver Function  Tests: Recent Labs    06/21/21 0000 12/19/21 0000 04/21/22 1046  AST 22 25 25   ALT 20 21 23   ALKPHOS 110  --  173*  BILITOT  --   --  2.4*  PROT  --   --  6.8  ALBUMIN 4.3 4.4 4.4   No  results for input(s): "LIPASE", "AMYLASE" in the last 8760 hours. No results for input(s): "AMMONIA" in the last 8760 hours. CBC: Recent Labs    12/19/21 0000 04/21/22 1046  WBC 5.6 6.2  NEUTROABS  --  4.2  HGB 13.2* 14.2  HCT 38* 40.4  MCV  --  101*  PLT  --  238   Lipid Panel: No results for input(s): "CHOL", "HDL", "LDLCALC", "TRIG", "CHOLHDL", "LDLDIRECT" in the last 8760 hours. TSH: No results for input(s): "TSH" in the last 8760 hours. A1C: No results found for: "HGBA1C"   Assessment/Plan 1. Other cirrhosis of liver (HCC) -followed by endocrinology, no change on recent ultrasound but requesting follow up labs in 6 weeks,  - Hepatic Function Panel  2. Chronic atrial fibrillation (HCC) Rate controlled, continues on metoprolol daily  3. Mixed hyperlipidemia -continues on pravachol 20 mg daily with dietary modifications.  - Lipid panel - Hepatic Function Panel  4. Primary osteoarthritis involving multiple joints Stable.   6. Acquired thrombophilia (HCC) -due to a fib, continues on xarelto for anticoagulation   7. Benign prostatic hyperplasia with urinary frequency Stable on flomax  8. Cardiomyopathy, unspecified type (HCC) -stable, continues on lasix  20 mg daily.   9. Rash  Improving at this time, to notify if rash worsens or symptoms worsen.   Next appt: 6 months.  Janene Harvey. Biagio Borg  [facility] [phone]

## 2022-06-20 NOTE — Patient Instructions (Signed)
Come to [facility] for LABS at 7:30 on Monday, JULY 29

## 2022-06-26 ENCOUNTER — Other Ambulatory Visit: Payer: Self-pay | Admitting: Nurse Practitioner

## 2022-06-29 ENCOUNTER — Encounter: Payer: Self-pay | Admitting: Gastroenterology

## 2022-07-14 DIAGNOSIS — G4733 Obstructive sleep apnea (adult) (pediatric): Secondary | ICD-10-CM | POA: Diagnosis not present

## 2022-07-25 ENCOUNTER — Ambulatory Visit: Payer: Medicare PPO | Admitting: Nurse Practitioner

## 2022-07-25 ENCOUNTER — Encounter: Payer: Self-pay | Admitting: Nurse Practitioner

## 2022-07-25 VITALS — BP 126/82 | HR 60 | Temp 98.2°F | Ht 66.0 in | Wt 183.0 lb

## 2022-07-25 DIAGNOSIS — K5730 Diverticulosis of large intestine without perforation or abscess without bleeding: Secondary | ICD-10-CM | POA: Diagnosis not present

## 2022-07-25 DIAGNOSIS — R040 Epistaxis: Secondary | ICD-10-CM

## 2022-07-25 NOTE — Progress Notes (Signed)
Careteam: Patient Care Team: Sharon Seller, NP as PCP - General (Geriatric Medicine)  Advanced Directive information Does Patient Have a Medical Advance Directive?: Yes, Type of Advance Directive: Healthcare Power of [place];Out of facility DNR (pink MOST or yellow form);Living will, Does patient want to make changes to medical advance directive?: No - Patient declined  No Known Allergies  Chief Complaint  Patient presents with   Acute Visit    Pain in Lower Abdomen follow up and complains of Nose Bleeds.    Quality Metric Gaps    To discuss need for Covid and Tdap     HPI: Patient is a 77 y.o. male seen in today at the [facility] for abdominal pain.  2 nights ago had a lot of pain, better at this time.  Yesterday took a tylenol and that helped.  Noticed when he was walking and breathing No sharp or stabbing.  Pain 2/10 now.  No pain with eating. No nausea.  Some cramping.   Has not taken any medication today for this.  Bowels moving well. No constipation or diarrhea.  Takes a stool softener.  Was having slight chills yesterday but unsure if he had fever.  Reports urine dark first thing in the morning but lightens up as the day goes.  Took a COVID test and it was negative for COVID but had a nose bleed after that.  No nasal congestion. Has been cautious not to blow nose excessively.   Review of Systems:  Review of Systems  Constitutional:  Negative for chills, fever and weight loss.  HENT:  Positive for nosebleeds. Negative for congestion and tinnitus.   Respiratory:  Negative for cough, sputum production and shortness of breath.   Cardiovascular:  Negative for chest pain, palpitations and leg swelling.  Gastrointestinal:  Positive for abdominal pain. Negative for constipation, diarrhea and heartburn.  Genitourinary:  Negative for dysuria, frequency and urgency.  Musculoskeletal:  Negative for back pain, falls, joint pain and myalgias.  Skin: Negative.    Neurological:  Negative for dizziness and headaches.  Psychiatric/Behavioral:  Negative for depression and memory loss. The patient does not have insomnia.     Past Medical History:  Diagnosis Date   Atrial fibrillation [facility])    Per [facility] new patient packet   BPH (benign prostatic hyperplasia)    Dysrhythmia    Elevated PSA    Enlarged prostate    High cholesterol    Hx of colonoscopy 2016   Per [facility] new patient packet   Hypertension    Sleep apnea    Per [facility] new patient packet   Slow urinary stream    Past Surgical History:  Procedure Laterality Date   COLONOSCOPY  07/03/2016   COLONOSCOPY WITH PROPOFOL N/A 10/06/2020   Procedure: COLONOSCOPY WITH PROPOFOL;  Surgeon: Wyline Mood, MD;  Location: [facility] ENDOSCOPY;  Service: Gastroenterology;  Laterality: N/A;   ESOPHAGOGASTRODUODENOSCOPY (EGD) WITH PROPOFOL N/A 11/22/2021   Procedure: ESOPHAGOGASTRODUODENOSCOPY (EGD) WITH PROPOFOL;  Surgeon: Wyline Mood, MD;  Location: [facility] ENDOSCOPY;  Service: Gastroenterology;  Laterality: N/A;   HERNIA REPAIR Right 2010   inguinal hernia    PROSTATE BIOPSY     TONSILLECTOMY     Social History:   reports that he has never smoked. He has never used smokeless tobacco. He reports current alcohol use of about 6.0 standard drinks of alcohol per week. He reports that he does not use drugs.  Family History  Problem Relation Age of Onset  Aneurysm Father    Heart attack Father    Heart disease Father     Medications: Patient's Medications  New Prescriptions   No medications on file  Previous Medications   FUROSEMIDE (LASIX) 20 MG TABLET    TAKE 1 TABLET BY MOUTH DAILY.   METOPROLOL SUCCINATE (TOPROL-XL) 25 MG 24 HR TABLET    TAKE ONE TABLET BY MOUTH EVERY DAY   POLYETHYLENE GLYCOL (MIRALAX / GLYCOLAX) 17 G PACKET    Take 17 g by mouth as needed.   PRAVASTATIN (PRAVACHOL) 20 MG TABLET    TAKE ONE TABLET BY MOUTH EVERY DAY   TAMSULOSIN (FLOMAX) 0.4 MG CAPS CAPSULE    TAKE 2 CAPSULES AT BEDTIME    TIZANIDINE (ZANAFLEX) 2 MG CAPSULE    Take 1 capsule (2 mg total) by mouth 3 (three) times daily as needed for muscle spasms.   XARELTO 20 MG TABS TABLET    TAKE ONE TABLET BY MOUTH EVERY DAY WITH EVENING MEAL  Modified Medications   No medications on file  Discontinued Medications   No medications on file    Physical Exam:  Vitals:   07/25/22 0832  BP: 126/82  Pulse: 60  Temp: 98.2 F (36.8 C)  SpO2: 99%  Weight: 183 lb (83 kg)  Height: 5\' 6"  (1.676 m)   Body mass index is 29.54 kg/m. Wt Readings from Last 3 Encounters:  07/25/22 183 lb (83 kg)  06/20/22 177 lb (80.3 kg)  02/06/22 167 lb 6 oz (75.9 kg)    Physical Exam Constitutional:      General: He is not in acute distress.    Appearance: He is well-developed. He is not diaphoretic.  HENT:     Head: Normocephalic and atraumatic.     Right Ear: External ear normal.     Left Ear: External ear normal.     Mouth/Throat:     Pharynx: No oropharyngeal exudate.  Eyes:     Conjunctiva/sclera: Conjunctivae normal.     Pupils: Pupils are equal, round, and reactive to light.  Cardiovascular:     Rate and Rhythm: Normal rate and regular rhythm.     Heart sounds: Normal heart sounds.  Pulmonary:     Effort: Pulmonary effort is normal.     Breath sounds: Normal breath sounds.  Abdominal:     General: Bowel sounds are normal.     Palpations: Abdomen is soft.  Musculoskeletal:        General: No tenderness.     Cervical back: Normal range of motion and neck supple.     Right lower leg: No edema.     Left lower leg: No edema.  Skin:    General: Skin is warm and dry.  Neurological:     Mental Status: He is alert and oriented to person, place, and time.     Labs reviewed: Basic Metabolic Panel: Recent Labs    08/25/21 0000 12/19/21 0000 04/21/22 1046  NA 138 139 139  K 4.0  --  4.0  CL 101 103 99  CO2 30* 31* 25  GLUCOSE  --   --  125*  BUN 23* 26* 19  CREATININE 0.9 1.0 0.96  CALCIUM 9.7 9.5 10.1    Liver Function Tests: Recent Labs    12/19/21 0000 04/21/22 1046  AST 25 25  ALT 21 23  ALKPHOS  --  173*  BILITOT  --  2.4*  PROT  --  6.8  ALBUMIN 4.4 4.4   No  results for input(s): "LIPASE", "AMYLASE" in the last 8760 hours. No results for input(s): "AMMONIA" in the last 8760 hours. CBC: Recent Labs    12/19/21 0000 04/21/22 1046  WBC 5.6 6.2  NEUTROABS  --  4.2  HGB 13.2* 14.2  HCT 38* 40.4  MCV  --  101*  PLT  --  238   Lipid Panel: No results for input(s): "CHOL", "HDL", "LDLCALC", "TRIG", "CHOLHDL", "LDLDIRECT" in the last 8760 hours. TSH: No results for input(s): "TSH" in the last 8760 hours. A1C: No results found for: "HGBA1C"   Assessment/Plan 1. Diverticulosis of colon -suspect abdominal discomfort was due to diverticula, pain/discomfort has improved at this time and mostly subsided.  Will have him start dietary modifications to avoid further flares.  No n/v or diarrhea noted.  To notify if symptoms worsen or new symptoms occur  2. Epistaxis -encouraged not to blow nose.  To use nasal saline throughout the day to provide moisture to nose.  To notify if symptoms continue to occur or if unable to stop to go to ED  Josehua Hammar K. Biagio Borg  [facility] [phone]

## 2022-07-26 ENCOUNTER — Other Ambulatory Visit: Payer: Self-pay | Admitting: Nurse Practitioner

## 2022-07-26 DIAGNOSIS — R600 Localized edema: Secondary | ICD-10-CM

## 2022-07-29 ENCOUNTER — Other Ambulatory Visit: Payer: Self-pay | Admitting: Nurse Practitioner

## 2022-07-31 DIAGNOSIS — E782 Mixed hyperlipidemia: Secondary | ICD-10-CM | POA: Diagnosis not present

## 2022-07-31 DIAGNOSIS — R748 Abnormal levels of other serum enzymes: Secondary | ICD-10-CM | POA: Diagnosis not present

## 2022-07-31 DIAGNOSIS — K7469 Other cirrhosis of liver: Secondary | ICD-10-CM | POA: Diagnosis not present

## 2022-08-01 ENCOUNTER — Encounter: Payer: Self-pay | Admitting: Nurse Practitioner

## 2022-08-01 NOTE — Progress Notes (Signed)
Would recommend checking GGT if normal then elevated alk phos is not from liver, if elevated then from liver and will need further work up

## 2022-08-07 ENCOUNTER — Telehealth: Payer: Self-pay

## 2022-08-07 DIAGNOSIS — K7469 Other cirrhosis of liver: Secondary | ICD-10-CM

## 2022-08-07 NOTE — Telephone Encounter (Signed)
-----   Message from Wyline Mood sent at 08/07/2022  9:19 AM EDT ----- Randy Montgomery  1. Check AMA, PTH, Ca , vitamin D , repeat LFT's in 6 weeks and schedule visit after to discuss next steps based on these results

## 2022-08-07 NOTE — Telephone Encounter (Signed)
Called patient but he did not answer. I left him a voicemail letting him know that Dr. Tobi Bastos wants him to check his labs again in 6 weeks. I will also send him a Mychart message with the instructions.

## 2022-08-07 NOTE — Progress Notes (Signed)
Randy Montgomery  1. Check AMA, PTH, Ca , vitamin D , repeat LFT's in 6 weeks and schedule visit after to discuss next steps based on these results

## 2022-08-09 ENCOUNTER — Encounter: Payer: Self-pay | Admitting: Cardiology

## 2022-08-09 ENCOUNTER — Ambulatory Visit: Payer: Medicare PPO | Attending: Cardiology | Admitting: Cardiology

## 2022-08-09 VITALS — BP 148/76 | HR 53 | Ht 67.0 in | Wt 182.8 lb

## 2022-08-09 DIAGNOSIS — I482 Chronic atrial fibrillation, unspecified: Secondary | ICD-10-CM

## 2022-08-09 DIAGNOSIS — E782 Mixed hyperlipidemia: Secondary | ICD-10-CM

## 2022-08-09 DIAGNOSIS — I10 Essential (primary) hypertension: Secondary | ICD-10-CM | POA: Diagnosis not present

## 2022-08-09 DIAGNOSIS — M7989 Other specified soft tissue disorders: Secondary | ICD-10-CM

## 2022-08-09 DIAGNOSIS — G4733 Obstructive sleep apnea (adult) (pediatric): Secondary | ICD-10-CM

## 2022-08-09 DIAGNOSIS — I429 Cardiomyopathy, unspecified: Secondary | ICD-10-CM | POA: Diagnosis not present

## 2022-08-09 NOTE — Progress Notes (Signed)
Cardiology Office Note:  .   Date:  08/09/2022  ID:  Randy Montgomery, DOB [birth year], MRN [id number] PCP: Sharon Seller, NP  [facility] Providers Cardiologist:  Julien Nordmann, MD    History of Present Illness: Randy Montgomery is a 77 y.o. male with past medical history of permanent atrial fibrillation with failed cardioversion in 2012, hyperlipidemia, OSA on CPAP, obstructive sleep apnea on CPAP, BPH, hyperlipidemia, hypertension, who is here today for follow-up on his atrial fibrillation.  Was previously diagnosed with atrial fibrillation dating back in 2011.  Had failed cardioversion in 2012.  Echocardiogram 09/2021 revealed an ejection fraction of 40 to 45%, RV function moderately reduced, left and right atrium severely dilated, moderate pleural effusion.  Started on Lasix 20 mg daily.  Leg swelling is improved over 2 months.  Continue to be compliant with the CPAP.  He was last seen in clinic 10/24/2021 by Dr. Mariah Milling for bilateral leg swelling.  He was continued on Lasix 20 mg daily.  Was noted to be bradycardic but was asymptomatic so we will continue to monitor his heart rate without changes to his metoprolol.  Denied any anginal symptoms so preferred not to have any further ischemic workup completed.  He returns to clinic today stating that he has been doing well from the cardiac perspective.  States he is continues to have occasional shortness of breath that is unchanged his leg swelling.  He continues to take his diuretic therapy with furosemide 20 mg daily and wears compression stockings.  He states that for the last couple months he has occasional nosebleed.  Using saline nose spray within his nose symptoms resolved issues that he had with nosebleeds.  Denies any blood noted in his urine or stool.  Has not had any hospitalizations or visits to the emergency department.  ROS: 10 point review of systems has been completed and considered negative with the exception of points were  listed in the HPI  Studies Reviewed: Marland Kitchen       TTE 09/02/21 1. Left ventricular ejection fraction, by estimation, is 40 to 45%. The  left ventricle has mildly decreased function. The left ventricle  demonstrates global hypokinesis. Left ventricular diastolic parameters are  indeterminate.   2. Right ventricular systolic function is moderately reduced. The right  ventricular size is severely enlarged. There is normal pulmonary artery  systolic pressure. The estimated right ventricular systolic pressure is  24.9 mmHg.   3. Left atrial size was severely dilated.   4. Right atrial size was severely dilated.   5. Moderate pleural effusion in the left lateral region.   6. The mitral valve is normal in structure. No evidence of mitral valve  regurgitation. No evidence of mitral stenosis.   7. Tricuspid valve regurgitation is moderate to severe.   8. The aortic valve is normal in structure. Aortic valve regurgitation is  not visualized. Aortic valve sclerosis/calcification is present, without  any evidence of aortic stenosis.   9. There is mild dilatation of the aortic root, measuring 41 mm.  10. The inferior vena cava is normal in size with greater than 50%  respiratory variability, suggesting right atrial pressure of 3 mmHg.   Risk Assessment/Calculations:    CHA2DS2-VASc Score = 3   This indicates a 3.2% annual risk of stroke. The patient's score is based upon: CHF History: 0 HTN History: 1 Diabetes History: 0 Stroke History: 0 Vascular Disease History: 0 Age Score: 2 Gender Score: 0  Physical Exam:   VS:  There were no vitals taken for this visit.   Wt Readings from Last 3 Encounters:  07/25/22 183 lb (83 kg)  06/20/22 177 lb (80.3 kg)  02/06/22 167 lb 6 oz (75.9 kg)    GEN: Well nourished, well developed in no acute distress NECK: No JVD; No carotid bruits CARDIAC: IR IR, bradycardic, no murmurs, rubs, gallops RESPIRATORY:  Clear to auscultation without rales,  wheezing or rhonchi  ABDOMEN: Soft, non-tender, non-distended EXTREMITIES:  1+ edema to BLE with compression stocking on; No deformity   ASSESSMENT AND PLAN: .   Persistent atrial fibrillation.  EKG today reveals rate controlled atrial fibrillation with PVCs/aberrancy syndrome and chronic right bundle branch block.  He is continued on Toprol-XL 25 mg daily just for rate control.  He is continued on rivaroxaban 20 mg daily for CHA2DS2-VASc score of 3 for stroke prophylaxis.  With the exception of a couple of nosebleeds he has not had any issues with with bleeding and his last hemoglobin checked by his PCP was 14.2.  Primary hypertension with blood pressure 148/76.  Patient is continued on Toprol-XL and furosemide.  Mixed hyperlipidemia with LDL 59.  He is continued on pravastatin 20 mg daily.  This continues to be managed by his PCP.  Peripheral edema to the bilateral lower extremities.  Patient is on furosemide 20 mg daily.  He has his compression stockings on as well.  He has been encouraged to continue to participate in conservative therapy of elevate his extremities, foot Pumps, compression stockings, decrease sodium and fluid intake.  When advised how much fluid that he has been drinking he states he is drinking a lot not to become dehydrated with the fluid medicine and I explained to him that it is difficult to remove the excess of fluid that he has if he continues to drink in excess fluid.  Patient encouraged to weigh self daily to trend weight and fluid retention.  A cardiomyopathy over the last LVEF 40-45% on echocardiogram done in 09/2021.  Patient's symptoms are on change with no repeat study ordered today.  Will consider reordering echo at return visit.  Continued on Toprol and furosemide.    Obstructive sleep apnea wearing continues to remain compliant with CPAP    Dispo: Patient to return to clinic in 6 months or sooner if needed to reevaluate symptoms.  Signed, Mykeisha Dysert, NP

## 2022-08-09 NOTE — Patient Instructions (Signed)
Medication Instructions:  Your physician recommends that you continue on your current medications as directed. Please refer to the Current Medication list given to you today.  *If you need a refill on your cardiac medications before your next appointment, please call your pharmacy*   Lab Work: none If you have labs (blood work) drawn today and your tests are completely normal, you will receive your results only by: MyChart Message (if you have MyChart) OR A paper copy in the mail If you have any lab test that is abnormal or we need to change your treatment, we will call you to review the results.   Testing/Procedures: none   Follow-Up: At [facility], you and your health needs are our priority.  As part of our continuing mission to provide you with exceptional heart care, we have created designated Provider Care Teams.  These Care Teams include your primary Cardiologist (physician) and Advanced Practice Providers (APPs -  Physician Assistants and Nurse Practitioners) who all work together to provide you with the care you need, when you need it.  We recommend signing up for the patient portal called "MyChart".  Sign up information is provided on this After Visit Summary.  MyChart is used to connect with patients for Virtual Visits (Telemedicine).  Patients are able to view lab/test results, encounter notes, upcoming appointments, etc.  Non-urgent messages can be sent to your provider as well.   To learn more about what you can do with MyChart, go to [email or web address].    Your next appointment:   6 month(s)  Provider:   Julien Nordmann, MD or Charlsie Quest, NP

## 2022-08-14 ENCOUNTER — Encounter: Payer: Self-pay | Admitting: Nurse Practitioner

## 2022-08-14 NOTE — Telephone Encounter (Signed)
Virtual Visit scheduled with Randy Montgomery for 08/14/2022 Patient aware.

## 2022-08-16 ENCOUNTER — Other Ambulatory Visit: Payer: Self-pay

## 2022-08-20 DIAGNOSIS — G4733 Obstructive sleep apnea (adult) (pediatric): Secondary | ICD-10-CM | POA: Diagnosis not present

## 2022-08-22 ENCOUNTER — Encounter: Payer: Self-pay | Admitting: Nurse Practitioner

## 2022-08-25 ENCOUNTER — Encounter: Payer: Self-pay | Admitting: Nurse Practitioner

## 2022-09-11 ENCOUNTER — Other Ambulatory Visit: Payer: Self-pay | Admitting: Nurse Practitioner

## 2022-09-11 DIAGNOSIS — K7469 Other cirrhosis of liver: Secondary | ICD-10-CM | POA: Diagnosis not present

## 2022-09-14 ENCOUNTER — Ambulatory Visit (SKILLED_NURSING_FACILITY): Payer: Medicare PPO | Admitting: Nurse Practitioner

## 2022-09-14 ENCOUNTER — Encounter: Payer: Self-pay | Admitting: Nurse Practitioner

## 2022-09-14 VITALS — BP 128/70 | HR 61 | Temp 98.1°F | Wt 184.0 lb

## 2022-09-14 DIAGNOSIS — K7469 Other cirrhosis of liver: Secondary | ICD-10-CM

## 2022-09-14 DIAGNOSIS — R600 Localized edema: Secondary | ICD-10-CM | POA: Diagnosis not present

## 2022-09-14 DIAGNOSIS — K4090 Unilateral inguinal hernia, without obstruction or gangrene, not specified as recurrent: Secondary | ICD-10-CM

## 2022-09-14 DIAGNOSIS — I482 Chronic atrial fibrillation, unspecified: Secondary | ICD-10-CM | POA: Diagnosis not present

## 2022-09-14 MED ORDER — POTASSIUM CHLORIDE CRYS ER 20 MEQ PO TBCR
20.0000 meq | EXTENDED_RELEASE_TABLET | Freq: Every day | ORAL | 3 refills | Status: DC
Start: 2022-09-14 — End: 2022-12-14

## 2022-09-14 MED ORDER — FUROSEMIDE 40 MG PO TABS: 40.0000 mg | ORAL_TABLET | Freq: Every day | ORAL | 2 refills | Status: DC

## 2022-09-14 NOTE — Progress Notes (Signed)
Careteam: Patient Care Team: Sharon Seller, NP as PCP - General (Geriatric Medicine) Antonieta Iba, MD as PCP - Cardiology (Cardiology) Wyline Mood, MD as Consulting Physician (Gastroenterology)  PLACE OF SERVICE:  [facility]  Advanced Directive information Does Patient Have a Medical Advance Directive?: Yes, Type of Advance Directive: Healthcare Power of [place];Out of facility DNR (pink MOST or yellow form);Living will, Does patient want to make changes to medical advance directive?: No - Patient declined  No Known Allergies  Chief Complaint  Patient presents with   Acute Visit    Concern with dealing with Hernia and Legs swollen and requesting Lasix to be increased.      HPI: Patient is a 77 y.o. male presents for shortness of breath. Seen at [facility]   Hx of A.fib., cirrhosis, right inguinal hernia. Having some shortness of breath when he is bending over, for example when tying/putting shoes on, not from exertion. Breathing improves with back extension and laying down. Saw cardiology in August and he did not report having issues with shortness of breath then. Reports it was probably there but has now gotten worse Has also noticed increase in bilateral lower extremity edema, as well as scrotal edema that has been present for months. Has been on lasix, and is wondering if he should increase dose. Notes some weight gain as well. Denies major changes in diet/exercise. Having bowel movements about every other day. History of constipation but relieved with miralax. Staying hydrated. Has right inguinal hernia which he thinks might be increasing in size thus causing some of his breathing issues. Denies pain/tenderness with hernia.   Review of Systems:  Review of Systems  Constitutional:  Negative for chills, fever and weight loss.  Respiratory:  Positive for shortness of breath. Negative for cough.   Cardiovascular:  Positive for leg swelling. Negative for chest  pain and palpitations.  Gastrointestinal:  Positive for constipation. Negative for abdominal pain, nausea and vomiting.  Musculoskeletal:  Negative for back pain, myalgias and neck pain.  Neurological:  Negative for dizziness, weakness and headaches.    Past Medical History:  Diagnosis Date   Atrial fibrillation [facility])    Per [facility] new patient packet   BPH (benign prostatic hyperplasia)    Dysrhythmia    Elevated alkaline phosphatase level    Elevated PSA    Enlarged prostate    High cholesterol    Hx of colonoscopy 2016   Per [facility] new patient packet   Hypertension    Sleep apnea    Per [facility] new patient packet   Slow urinary stream    Past Surgical History:  Procedure Laterality Date   COLONOSCOPY  07/03/2016   COLONOSCOPY WITH PROPOFOL N/A 10/06/2020   Procedure: COLONOSCOPY WITH PROPOFOL;  Surgeon: Wyline Mood, MD;  Location: [facility] ENDOSCOPY;  Service: Gastroenterology;  Laterality: N/A;   ESOPHAGOGASTRODUODENOSCOPY (EGD) WITH PROPOFOL N/A 11/22/2021   Procedure: ESOPHAGOGASTRODUODENOSCOPY (EGD) WITH PROPOFOL;  Surgeon: Wyline Mood, MD;  Location: [facility] ENDOSCOPY;  Service: Gastroenterology;  Laterality: N/A;   HERNIA REPAIR Right 2010   inguinal hernia    PROSTATE BIOPSY     TONSILLECTOMY     Social History:   reports that he has never smoked. He has never used smokeless tobacco. He reports current alcohol use of about 6.0 standard drinks of alcohol per week. He reports that he does not use drugs.  Family History  Problem Relation Age of Onset   Aneurysm Father    Heart attack  Father    Heart disease Father     Medications: Patient's Medications  New Prescriptions   POTASSIUM CHLORIDE SA (KLOR-CON M) 20 MEQ TABLET    Take 1 tablet (20 mEq total) by mouth daily.  Previous Medications   METOPROLOL SUCCINATE (TOPROL-XL) 25 MG 24 HR TABLET    TAKE ONE TABLET BY MOUTH EVERY DAY   POLYETHYLENE GLYCOL (MIRALAX / GLYCOLAX) 17 G PACKET    Take 17 g by mouth as needed.    PRAVASTATIN (PRAVACHOL) 20 MG TABLET    TAKE ONE TABLET BY MOUTH EVERY DAY   TAMSULOSIN (FLOMAX) 0.4 MG CAPS CAPSULE    TAKE 2 CAPSULES AT BEDTIME   TIZANIDINE (ZANAFLEX) 2 MG CAPSULE    Take 1 capsule (2 mg total) by mouth 3 (three) times daily as needed for muscle spasms.   XARELTO 20 MG TABS TABLET    TAKE ONE TABLET BY MOUTH EVERY DAY WITH EVENING MEAL  Modified Medications   Modified Medication Previous Medication   FUROSEMIDE (LASIX) 40 MG TABLET furosemide (LASIX) 20 MG tablet      Take 1 tablet (40 mg total) by mouth daily.    TAKE 1 TABLET BY MOUTH DAILY.  Discontinued Medications   No medications on file    Physical Exam:  Vitals:   09/14/22 1104  BP: 128/70  Pulse: 61  Temp: 98.1 F (36.7 C)  TempSrc: Oral  SpO2: 98%  Weight: 83.5 kg   Body mass index is 28.82 kg/m. Wt Readings from Last 3 Encounters:  09/14/22 83.5 kg  08/09/22 82.9 kg  07/25/22 83 kg    Physical Exam Constitutional:      Appearance: Normal appearance.  Cardiovascular:     Rate and Rhythm: Rhythm irregular.     Pulses: Normal pulses.  Pulmonary:     Effort: Pulmonary effort is normal.     Breath sounds: Normal breath sounds.  Abdominal:     General: Bowel sounds are normal.     Hernia: A hernia is present. Hernia is present in the right inguinal area.  Genitourinary:    Testes:        Right: Swelling (right greater than left) present.        Left: Swelling present.  Musculoskeletal:        General: Normal range of motion.     Right lower leg: Edema (pitting) present.     Left lower leg: Edema (pitting) present.  Skin:    General: Skin is warm and dry.  Neurological:     General: No focal deficit present.     Mental Status: He is alert and oriented to person, place, and time. Mental status is at baseline.  Psychiatric:        Mood and Affect: Mood normal.        Behavior: Behavior normal.     Labs reviewed: Basic Metabolic Panel: Recent Labs    12/19/21 0000 04/21/22 1046  09/11/22 1347  NA 139 139  --   K  --  4.0  --   CL 103 99  --   CO2 31* 25  --   GLUCOSE  --  125*  --   BUN 26* 19  --   CREATININE 1.0 0.96  --   CALCIUM 9.5 10.1 9.4   Liver Function Tests: Recent Labs    12/19/21 0000 04/21/22 1046 07/31/22 0740 09/11/22 1347  AST 25 25 23 21   ALT 21 23 15 15   ALKPHOS  --  173*  --  185*  BILITOT  --  2.4* 3.1* 3.9*  PROT  --  6.8 6.5 6.2  ALBUMIN 4.4 4.4  --  4.1   No results for input(s): "LIPASE", "AMYLASE" in the last 8760 hours. No results for input(s): "AMMONIA" in the last 8760 hours. CBC: Recent Labs    12/19/21 0000 04/21/22 1046  WBC 5.6 6.2  NEUTROABS  --  4.2  HGB 13.2* 14.2  HCT 38* 40.4  MCV  --  101*  PLT  --  238   Lipid Panel: Recent Labs    07/31/22 0740  CHOL 133  HDL 61  LDLCALC 59  TRIG 51  CHOLHDL 2.2   TSH: No results for input(s): "TSH" in the last 8760 hours. A1C: No results found for: "HGBA1C"   Assessment/Plan 1. Inguinal hernia, right Increase in size to right inguinal hernia, without pain noted but more swelling to hernia and scrotum  - CT ABDOMEN PELVIS WO CONTRAST; Future  2. Chronic atrial fibrillation (HCC) -Rate controlled, has had recent follow up with cardiology -Continue metoprolol succinate/Toprol-XL and Xarelto  3. Other cirrhosis of liver (HCC) -noted to have increase in tightness to abdomen with slightly more swelling to abdomen - CT ABDOMEN PELVIS WO CONTRAST; Future to evauate ascites  - Complete Metabolic Panel with eGFR  4. Bilateral leg edema -due to swelling in legs and abdomen will increase furosemide dose to 40 mg daily, start potassium - furosemide (LASIX) 40 MG tablet; Take 1 tablet (40 mg total) by mouth daily.  Dispense: 30 tablet; Refill: 2 - potassium chloride SA (KLOR-CON M) 20 MEQ tablet; Take 1 tablet (20 mEq total) by mouth daily.  Dispense: 30 tablet; Refill: 3 - Complete Metabolic Panel with eGFR on 9/16    Will have him follow up based on  results.   Rollen Sox, FNP-MSN Student I personally was present during the history, physical exam and medical decision-making activities of this service and have verified that the service and findings are accurately documented in the student's note  Abbey Chatters, NP

## 2022-09-14 NOTE — Patient Instructions (Addendum)
Increase lasix to 40 mg daily  Take potassium 20 meq with potassium  CT abdomen ordered to fully evaluate  To come back to [facility] for lab 9/16 at 7:30 am

## 2022-09-15 ENCOUNTER — Ambulatory Visit
Admission: RE | Admit: 2022-09-15 | Discharge: 2022-09-15 | Disposition: A | Discharge status: Home / Self Care | Payer: Medicare PPO | Source: Ambulatory Visit | Attending: Nurse Practitioner | Admitting: Nurse Practitioner

## 2022-09-15 ENCOUNTER — Other Ambulatory Visit: Payer: Self-pay | Admitting: Orthopedic Surgery

## 2022-09-15 ENCOUNTER — Encounter: Payer: Self-pay | Admitting: Nurse Practitioner

## 2022-09-15 DIAGNOSIS — K7460 Unspecified cirrhosis of liver: Secondary | ICD-10-CM | POA: Diagnosis not present

## 2022-09-15 DIAGNOSIS — K4090 Unilateral inguinal hernia, without obstruction or gangrene, not specified as recurrent: Secondary | ICD-10-CM

## 2022-09-15 DIAGNOSIS — K7469 Other cirrhosis of liver: Secondary | ICD-10-CM

## 2022-09-15 DIAGNOSIS — R109 Unspecified abdominal pain: Secondary | ICD-10-CM | POA: Diagnosis not present

## 2022-09-15 DIAGNOSIS — R188 Other ascites: Secondary | ICD-10-CM | POA: Diagnosis not present

## 2022-09-15 NOTE — Telephone Encounter (Signed)
Patient notified and agreed.

## 2022-09-15 NOTE — Telephone Encounter (Signed)
Forwarded message to Amy due to Shanda Bumps out of office.

## 2022-09-18 DIAGNOSIS — R600 Localized edema: Secondary | ICD-10-CM | POA: Diagnosis not present

## 2022-09-18 DIAGNOSIS — K7469 Other cirrhosis of liver: Secondary | ICD-10-CM | POA: Diagnosis not present

## 2022-09-18 LAB — COMPLETE METABOLIC PANEL WITH GFR
AG Ratio: 1.6 (calc) (ref 1.0–2.5)
ALT: 18 U/L (ref 9–46)
AST: 24 U/L (ref 10–35)
Albumin: 4.1 g/dL (ref 3.6–5.1)
Alkaline phosphatase (APISO): 168 U/L — ABNORMAL HIGH (ref 35–144)
BUN/Creatinine Ratio: 36 (calc) — ABNORMAL HIGH (ref 6–22)
BUN: 27 mg/dL — ABNORMAL HIGH (ref 7–25)
CO2: 27 mmol/L (ref 20–32)
Calcium: 9.5 mg/dL (ref 8.6–10.3)
Chloride: 104 mmol/L (ref 98–110)
Creat: 0.75 mg/dL (ref 0.70–1.28)
Globulin: 2.5 g/dL (ref 1.9–3.7)
Glucose, Bld: 80 mg/dL (ref 65–99)
Potassium: 4.3 mmol/L (ref 3.5–5.3)
Sodium: 141 mmol/L (ref 135–146)
Total Bilirubin: 3.8 mg/dL — ABNORMAL HIGH (ref 0.2–1.2)
Total Protein: 6.6 g/dL (ref 6.1–8.1)
eGFR: 92 mL/min/{1.73_m2} (ref >=60)

## 2022-09-19 ENCOUNTER — Encounter: Payer: Self-pay | Admitting: Nurse Practitioner

## 2022-09-19 ENCOUNTER — Ambulatory Visit: Payer: Medicare PPO | Admitting: Nurse Practitioner

## 2022-09-19 ENCOUNTER — Ambulatory Visit
Admission: RE | Admit: 2022-09-19 | Discharge: 2022-09-19 | Disposition: A | Discharge status: Home / Self Care | Payer: Medicare PPO | Source: Ambulatory Visit | Attending: Nurse Practitioner | Admitting: Nurse Practitioner

## 2022-09-19 VITALS — BP 126/78 | HR 69 | Temp 98.1°F | Ht 66.0 in | Wt 180.0 lb

## 2022-09-19 DIAGNOSIS — R350 Frequency of micturition: Secondary | ICD-10-CM

## 2022-09-19 DIAGNOSIS — I482 Chronic atrial fibrillation, unspecified: Secondary | ICD-10-CM

## 2022-09-19 DIAGNOSIS — I700 Atherosclerosis of aorta: Secondary | ICD-10-CM | POA: Diagnosis not present

## 2022-09-19 DIAGNOSIS — R0602 Shortness of breath: Secondary | ICD-10-CM | POA: Diagnosis not present

## 2022-09-19 DIAGNOSIS — J90 Pleural effusion, not elsewhere classified: Secondary | ICD-10-CM

## 2022-09-19 DIAGNOSIS — N401 Enlarged prostate with lower urinary tract symptoms: Secondary | ICD-10-CM

## 2022-09-19 DIAGNOSIS — K7469 Other cirrhosis of liver: Secondary | ICD-10-CM

## 2022-09-19 DIAGNOSIS — K4090 Unilateral inguinal hernia, without obstruction or gangrene, not specified as recurrent: Secondary | ICD-10-CM

## 2022-09-19 NOTE — Progress Notes (Signed)
Careteam: Patient Care Team: Sharon Seller, NP as PCP - General (Geriatric Medicine) Antonieta Iba, MD as PCP - Cardiology (Cardiology) Wyline Mood, MD as Consulting Physician (Gastroenterology)  Advanced Directive information Does Patient Have a Medical Advance Directive?: Yes, Type of Advance Directive: Healthcare Power of [place];Out of facility DNR (pink MOST or yellow form);Living will, Does patient want to make changes to medical advance directive?: No - Patient declined  No Known Allergies  Chief Complaint  Patient presents with   Acute Visit    Discuss CT Scan Results     HPI: Patient is a 77 y.o. male seen in today at the [facility] for evaluation of CT results.   CT ABDOMEN PELVIS WO CONTRAST  Result Date: 09/15/2022 CLINICAL DATA:  Abdominal pain and swelling for 3 months. Hepatic cirrhosis. EXAM: CT ABDOMEN AND PELVIS WITHOUT CONTRAST TECHNIQUE: Multidetector CT imaging of the abdomen and pelvis was performed following the standard protocol without IV contrast. RADIATION DOSE REDUCTION: This exam was performed according to the departmental dose-optimization program which includes automated exposure control, adjustment of the mA and/or kV according to patient size and/or use of iterative reconstruction technique. COMPARISON:  May 05, 2022. FINDINGS: Lower chest: Moderate size right pleural effusion is noted with adjacent atelectasis. Minimal left pleural effusion is noted. Hepatobiliary: No cholelithiasis or biliary dilatation. Nodular hepatic contours are noted consistent with cirrhosis. Probable 9 mm cyst seen in left hepatic lobe. Pancreas: Unremarkable. No pancreatic ductal dilatation or surrounding inflammatory changes. Spleen: Normal in size without focal abnormality. Adrenals/Urinary Tract: Adrenal glands are unremarkable. Bilateral renal cysts are noted for which no further follow-up is required. No hydronephrosis or renal obstruction is noted. Urinary  bladder is unremarkable. Stomach/Bowel: Stomach is unremarkable. Sigmoid diverticulosis is noted without inflammation. Large right inguinal hernia is noted which contains a large portion of the cecum and terminal ileum, but does not result in obstruction. The appendix also is noted within the hernia and appears grossly normal. Vascular/Lymphatic: Aortic atherosclerosis. No enlarged abdominal or pelvic lymph nodes. Reproductive: Moderate prostatic enlargement is noted. Other: Minimal ascites is noted around the liver. Musculoskeletal: No acute or significant osseous findings. IMPRESSION: Large right inguinal hernia is noted which extends into the scrotum and contains a large portion of the cecum and terminal ileum, but does not result in obstruction. Hepatic cirrhosis is noted with minimal perihepatic ascites. Moderate size right pleural effusion is noted with adjacent atelectasis of the right lower lobe. Moderate prostatic enlargement. Aortic Atherosclerosis (ICD10-I70.0). Electronically Signed   By: Lupita Raider M.D.   On: 09/15/2022 11:54     He reports he has been putting off being evaluated for the scrotum but now realize something needs to be done.   Reports he still notices issues with breathing. No shortness of breath with walking or going up stairs. Sometimes talking will make him more short of breath.  LE edema is also the same.   Moderate prostate enlargement- he was following with urology but did not have time to follow up. Worried about recovery time for any procedure.    Review of Systems:  Review of Systems  Constitutional:  Negative for chills, fever and weight loss.  HENT:  Negative for tinnitus.   Respiratory:  Negative for cough, sputum production and shortness of breath.   Cardiovascular:  Negative for chest pain, palpitations and leg swelling.  Gastrointestinal:  Negative for abdominal pain, constipation, diarrhea and heartburn.  Genitourinary:  Negative for dysuria,  frequency  and urgency.  Musculoskeletal:  Negative for back pain, falls, joint pain and myalgias.  Skin: Negative.   Neurological:  Negative for dizziness and headaches.  Psychiatric/Behavioral:  Negative for depression and memory loss. The patient does not have insomnia.     Past Medical History:  Diagnosis Date   Atrial fibrillation [facility])    Per [facility] new patient packet   BPH (benign prostatic hyperplasia)    Dysrhythmia    Elevated alkaline phosphatase level    Elevated PSA    Enlarged prostate    High cholesterol    Hx of colonoscopy 2016   Per [facility] new patient packet   Hypertension    Sleep apnea    Per [facility] new patient packet   Slow urinary stream    Past Surgical History:  Procedure Laterality Date   COLONOSCOPY  07/03/2016   COLONOSCOPY WITH PROPOFOL N/A 10/06/2020   Procedure: COLONOSCOPY WITH PROPOFOL;  Surgeon: Wyline Mood, MD;  Location: [facility] ENDOSCOPY;  Service: Gastroenterology;  Laterality: N/A;   ESOPHAGOGASTRODUODENOSCOPY (EGD) WITH PROPOFOL N/A 11/22/2021   Procedure: ESOPHAGOGASTRODUODENOSCOPY (EGD) WITH PROPOFOL;  Surgeon: Wyline Mood, MD;  Location: [facility] ENDOSCOPY;  Service: Gastroenterology;  Laterality: N/A;   HERNIA REPAIR Right 2010   inguinal hernia    PROSTATE BIOPSY     TONSILLECTOMY     Social History:   reports that he has never smoked. He has never used smokeless tobacco. He reports current alcohol use of about 6.0 standard drinks of alcohol per week. He reports that he does not use drugs.  Family History  Problem Relation Age of Onset   Aneurysm Father    Heart attack Father    Heart disease Father     Medications: Patient's Medications  New Prescriptions   No medications on file  Previous Medications   FUROSEMIDE (LASIX) 40 MG TABLET    Take 1 tablet (40 mg total) by mouth daily.   METOPROLOL SUCCINATE (TOPROL-XL) 25 MG 24 HR TABLET    TAKE ONE TABLET BY MOUTH EVERY DAY   POLYETHYLENE GLYCOL (MIRALAX / GLYCOLAX) 17 G PACKET    Take 17 g  by mouth as needed.   POTASSIUM CHLORIDE SA (KLOR-CON M) 20 MEQ TABLET    Take 1 tablet (20 mEq total) by mouth daily.   PRAVASTATIN (PRAVACHOL) 20 MG TABLET    TAKE ONE TABLET BY MOUTH EVERY DAY   TAMSULOSIN (FLOMAX) 0.4 MG CAPS CAPSULE    TAKE 2 CAPSULES AT BEDTIME   TIZANIDINE (ZANAFLEX) 2 MG CAPSULE    Take 1 capsule (2 mg total) by mouth 3 (three) times daily as needed for muscle spasms.   XARELTO 20 MG TABS TABLET    TAKE ONE TABLET BY MOUTH EVERY DAY WITH EVENING MEAL  Modified Medications   No medications on file  Discontinued Medications   No medications on file    Physical Exam:  Vitals:   09/19/22 1037  BP: 126/78  Pulse: 69  Temp: 98.1 F (36.7 C)  SpO2: 93%  Weight: 180 lb (81.6 kg)  Height: 5\' 6"  (1.676 m)   Body mass index is 29.05 kg/m. Wt Readings from Last 3 Encounters:  09/19/22 180 lb (81.6 kg)  09/14/22 184 lb (83.5 kg)  08/09/22 182 lb 12.8 oz (82.9 kg)    Physical Exam Constitutional:      General: He is not in acute distress.    Appearance: He is well-developed. He is not diaphoretic.  HENT:     Head: Normocephalic and  atraumatic.     Right Ear: External ear normal.     Left Ear: External ear normal.     Mouth/Throat:     Pharynx: No oropharyngeal exudate.  Eyes:     Conjunctiva/sclera: Conjunctivae normal.     Pupils: Pupils are equal, round, and reactive to light.  Cardiovascular:     Rate and Rhythm: Normal rate and regular rhythm.     Heart sounds: Normal heart sounds.  Pulmonary:     Effort: Pulmonary effort is normal.     Breath sounds: Normal breath sounds.  Abdominal:     General: Bowel sounds are normal.     Palpations: Abdomen is soft.  Musculoskeletal:        General: No tenderness.     Cervical back: Normal range of motion and neck supple.     Right lower leg: Edema present.     Left lower leg: Edema present.  Skin:    General: Skin is warm and dry.  Neurological:     Mental Status: He is alert and oriented to person,  place, and time.     Labs reviewed: Basic Metabolic Panel: Recent Labs    12/19/21 0000 04/21/22 1046 09/11/22 1347 09/18/22 0800  NA 139 139  --  141  K  --  4.0  --  4.3  CL 103 99  --  104  CO2 31* 25  --  27  GLUCOSE  --  125*  --  80  BUN 26* 19  --  27*  CREATININE 1.0 0.96  --  0.75  CALCIUM 9.5 10.1 9.4 9.5   Liver Function Tests: Recent Labs    12/19/21 0000 04/21/22 1046 07/31/22 0740 09/11/22 1347 09/18/22 0800  AST 25 25 23 21 24   ALT 21 23 15 15 18   ALKPHOS  --  173*  --  185*  --   BILITOT  --  2.4* 3.1* 3.9* 3.8*  PROT  --  6.8 6.5 6.2 6.6  ALBUMIN 4.4 4.4  --  4.1  --    No results for input(s): "LIPASE", "AMYLASE" in the last 8760 hours. No results for input(s): "AMMONIA" in the last 8760 hours. CBC: Recent Labs    12/19/21 0000 04/21/22 1046  WBC 5.6 6.2  NEUTROABS  --  4.2  HGB 13.2* 14.2  HCT 38* 40.4  MCV  --  101*  PLT  --  238   Lipid Panel: Recent Labs    07/31/22 0740  CHOL 133  HDL 61  LDLCALC 59  TRIG 51  CHOLHDL 2.2   TSH: No results for input(s): "TSH" in the last 8760 hours. A1C: No results found for: "HGBA1C"  CT ABDOMEN PELVIS WO CONTRAST  Result Date: 09/15/2022 CLINICAL DATA:  Abdominal pain and swelling for 3 months. Hepatic cirrhosis. EXAM: CT ABDOMEN AND PELVIS WITHOUT CONTRAST TECHNIQUE: Multidetector CT imaging of the abdomen and pelvis was performed following the standard protocol without IV contrast. RADIATION DOSE REDUCTION: This exam was performed according to the departmental dose-optimization program which includes automated exposure control, adjustment of the mA and/or kV according to patient size and/or use of iterative reconstruction technique. COMPARISON:  May 05, 2022. FINDINGS: Lower chest: Moderate size right pleural effusion is noted with adjacent atelectasis. Minimal left pleural effusion is noted. Hepatobiliary: No cholelithiasis or biliary dilatation. Nodular hepatic contours are noted consistent  with cirrhosis. Probable 9 mm cyst seen in left hepatic lobe. Pancreas: Unremarkable. No pancreatic ductal dilatation or surrounding inflammatory changes.  Spleen: Normal in size without focal abnormality. Adrenals/Urinary Tract: Adrenal glands are unremarkable. Bilateral renal cysts are noted for which no further follow-up is required. No hydronephrosis or renal obstruction is noted. Urinary bladder is unremarkable. Stomach/Bowel: Stomach is unremarkable. Sigmoid diverticulosis is noted without inflammation. Large right inguinal hernia is noted which contains a large portion of the cecum and terminal ileum, but does not result in obstruction. The appendix also is noted within the hernia and appears grossly normal. Vascular/Lymphatic: Aortic atherosclerosis. No enlarged abdominal or pelvic lymph nodes. Reproductive: Moderate prostatic enlargement is noted. Other: Minimal ascites is noted around the liver. Musculoskeletal: No acute or significant osseous findings. IMPRESSION: Large right inguinal hernia is noted which extends into the scrotum and contains a large portion of the cecum and terminal ileum, but does not result in obstruction. Hepatic cirrhosis is noted with minimal perihepatic ascites. Moderate size right pleural effusion is noted with adjacent atelectasis of the right lower lobe. Moderate prostatic enlargement. Aortic Atherosclerosis (ICD10-I70.0). Electronically Signed   By: Lupita Raider M.D.   On: 09/15/2022 11:54   Assessment/Plan 1. Inguinal hernia, right Surgery referral has been placed and appt scheduled  2. Pleural effusion, right He does not have any shortness of breath with activity or cough noted, lungs CTA. Lasix was increased at last visit due to LE edema.  will follow up chest xray - DG Chest 2 View; Future  3. Benign prostatic hyperplasia with urinary frequency -enlarged prostate noted on CT, he has urologist, no new symptoms   4. Aortic atherosclerosis (HCC) Continues on  statin and xarelto   5. Chronic atrial fibrillation (HCC) -rate controlled. Followed by cardiology  Continues on xarelto for anticoagulation.   6. Other cirrhosis of liver (HCC) Has follow up scheduled with GI next week. No worsening ascites on CT   Azaela Caracci K. Biagio Borg  [facility] [phone]

## 2022-09-26 ENCOUNTER — Encounter: Payer: Self-pay | Admitting: Gastroenterology

## 2022-09-26 ENCOUNTER — Ambulatory Visit: Payer: Medicare PPO | Admitting: Gastroenterology

## 2022-09-26 VITALS — BP 123/58 | HR 47 | Temp 97.6°F | Wt 172.4 lb

## 2022-09-26 DIAGNOSIS — R748 Abnormal levels of other serum enzymes: Secondary | ICD-10-CM | POA: Diagnosis not present

## 2022-09-26 DIAGNOSIS — R7989 Other specified abnormal findings of blood chemistry: Secondary | ICD-10-CM

## 2022-09-26 DIAGNOSIS — K7469 Other cirrhosis of liver: Secondary | ICD-10-CM

## 2022-09-26 DIAGNOSIS — F1091 Alcohol use, unspecified, in remission: Secondary | ICD-10-CM

## 2022-09-26 DIAGNOSIS — K7460 Unspecified cirrhosis of liver: Secondary | ICD-10-CM

## 2022-09-26 MED ORDER — VITAMIN D (ERGOCALCIFEROL) 1.25 MG (50000 UNIT) PO CAPS: 50000.0000 [IU] | ORAL_CAPSULE | ORAL | 0 refills | Status: DC

## 2022-09-26 MED ORDER — VITAMIN D (ERGOCALCIFEROL) 1.25 MG (50000 UNIT) PO CAPS: 50000.0000 [IU] | ORAL_CAPSULE | ORAL | 2 refills | Status: DC

## 2022-09-26 NOTE — Addendum Note (Signed)
Addended by: Adela Ports on: 09/26/2022 03:15 PM   Modules accepted: Orders

## 2022-09-26 NOTE — Addendum Note (Signed)
Addended by: Adela Ports on: 09/26/2022 04:10 PM   Modules accepted: Orders

## 2022-09-26 NOTE — Progress Notes (Signed)
Wyline Mood MD, MRCP(U.K) [address]  [address]  [place], [place] [address]  Main: [phone]  Fax: [phone]   Primary Care Physician: Sharon Seller, NP  Primary Gastroenterologist:  Dr. Wyline Mood   Chief Complaint  Patient presents with   Cirrhosis    HPI: Randy Montgomery is a 77 y.o. male Summary of history :   Initially seen in October 2023 and was found to have features of cirrhosis likely secondary to alcohol excess consumption over 20 to 30 years..  With perihepatic ascites on a recent ultrasound.He denies any illegal drug use. No family history of liver disease.  10/31/2021: AFP normal not immune to hepatitis A and B recommended vaccine otherwise viral hepatitis and autoimmune markers were negative 11/23/2022: EGD: Hiatal hernia seen no evidence of esophageal varices    Interval history  02/06/2022-09/26/2022 MELD score of 11 in April 2024 05/05/2022: Right upper quadrant ultrasound no liver masses seen. 09/15/2022 CT scan of the abdomen pelvis without contrast shows large right inguinal hernia which extends to the scrotum contains a large portion of cecum hepatic cirrhosis moderate size pleural effusion with adjacent atelectasis the right lower lobe.   07/31/2022: GGT 214 09/18/2022 alkaline phosphatase 168 total bilirubin 3.8 AST 24 ALT 18 09/11/2022 PTH calcium normal vitamin D low  He is doing very well he has completely stopped drinking alcohol in July 2024.  He states he has no further desire to drink alcohol.  His breathing has improved significantly after commencing on Lasix.  He denies any issues with sleep at night.  Denies any NSAID use.  Having regular bowel movements.  Current Outpatient Medications  Medication Sig Dispense Refill   furosemide (LASIX) 40 MG tablet Take 1 tablet (40 mg total) by mouth daily. 30 tablet 2   metoprolol succinate (TOPROL-XL) 25 MG 24 hr tablet TAKE ONE TABLET BY MOUTH EVERY DAY 90 tablet 1   polyethylene glycol  (MIRALAX / GLYCOLAX) 17 g packet Take 17 g by mouth as needed.     potassium chloride SA (KLOR-CON M) 20 MEQ tablet Take 1 tablet (20 mEq total) by mouth daily. 30 tablet 3   pravastatin (PRAVACHOL) 20 MG tablet TAKE ONE TABLET BY MOUTH EVERY DAY 90 tablet 1   tamsulosin (FLOMAX) 0.4 MG CAPS capsule TAKE 2 CAPSULES AT BEDTIME 180 capsule 1   tizanidine (ZANAFLEX) 2 MG capsule Take 1 capsule (2 mg total) by mouth 3 (three) times daily as needed for muscle spasms. 20 capsule 1   XARELTO 20 MG TABS tablet TAKE ONE TABLET BY MOUTH EVERY DAY WITH EVENING MEAL 90 tablet 1   No current facility-administered medications for this visit.    Allergies as of 09/26/2022   (No Known Allergies)      ROS:  General: Negative for anorexia, weight loss, fever, chills, fatigue, weakness. ENT: Negative for hoarseness, difficulty swallowing , nasal congestion. CV: Negative for chest pain, angina, palpitations, dyspnea on exertion, peripheral edema.  Respiratory: Negative for dyspnea at rest, dyspnea on exertion, cough, sputum, wheezing.  GI: See history of present illness. GU:  Negative for dysuria, hematuria, urinary incontinence, urinary frequency, nocturnal urination.  Endo: Negative for unusual weight change.    Physical Examination:   BP (!) 123/58   Pulse (!) 47   Temp 97.6 F (36.4 C) (Oral)   Wt 172 lb 6.4 oz (78.2 kg)   BMI 27.83 kg/m   General: Well-nourished, well-developed in no acute distress.  Eyes: No icterus. Conjunctivae pink.  Mouth: Oropharyngeal mucosa moist and pink , no lesions erythema or exudate. Neuro: Alert and oriented x 3.  Grossly intact. Skin: Warm and dry, no jaundice.   Psych: Alert and cooperative, normal mood and affect.   Imaging Studies: CT ABDOMEN PELVIS WO CONTRAST  Result Date: 09/15/2022 CLINICAL DATA:  Abdominal pain and swelling for 3 months. Hepatic cirrhosis. EXAM: CT ABDOMEN AND PELVIS WITHOUT CONTRAST TECHNIQUE: Multidetector CT imaging of the  abdomen and pelvis was performed following the standard protocol without IV contrast. RADIATION DOSE REDUCTION: This exam was performed according to the departmental dose-optimization program which includes automated exposure control, adjustment of the mA and/or kV according to patient size and/or use of iterative reconstruction technique. COMPARISON:  May 05, 2022. FINDINGS: Lower chest: Moderate size right pleural effusion is noted with adjacent atelectasis. Minimal left pleural effusion is noted. Hepatobiliary: No cholelithiasis or biliary dilatation. Nodular hepatic contours are noted consistent with cirrhosis. Probable 9 mm cyst seen in left hepatic lobe. Pancreas: Unremarkable. No pancreatic ductal dilatation or surrounding inflammatory changes. Spleen: Normal in size without focal abnormality. Adrenals/Urinary Tract: Adrenal glands are unremarkable. Bilateral renal cysts are noted for which no further follow-up is required. No hydronephrosis or renal obstruction is noted. Urinary bladder is unremarkable. Stomach/Bowel: Stomach is unremarkable. Sigmoid diverticulosis is noted without inflammation. Large right inguinal hernia is noted which contains a large portion of the cecum and terminal ileum, but does not result in obstruction. The appendix also is noted within the hernia and appears grossly normal. Vascular/Lymphatic: Aortic atherosclerosis. No enlarged abdominal or pelvic lymph nodes. Reproductive: Moderate prostatic enlargement is noted. Other: Minimal ascites is noted around the liver. Musculoskeletal: No acute or significant osseous findings. IMPRESSION: Large right inguinal hernia is noted which extends into the scrotum and contains a large portion of the cecum and terminal ileum, but does not result in obstruction. Hepatic cirrhosis is noted with minimal perihepatic ascites. Moderate size right pleural effusion is noted with adjacent atelectasis of the right lower lobe. Moderate prostatic  enlargement. Aortic Atherosclerosis (ICD10-I70.0). Electronically Signed   By: Lupita Raider M.D.   On: 09/15/2022 11:54    Assessment and Plan:   Zarian Fennell is a 77 y.o. y/o male here to follow-up for liver cirrhosis.  Based on history very likely secondary to chronic excess alcohol consumption over the years.  Autoimmune and viral hepatitis workup has been negative.  Compensated, MELD score of 11 in April 2024.  His alkaline phosphatase is elevated.  Autoimmune markers previously were negative.  It could be just due to low vitamin D as well as excessive use of alcohol.   Plan  1.  Hepatitis A and B vaccine, replace low vitamin D 2.  Screen for [facility] with AFP and ultrasound in November 24 3.  Screen for esophageal varices in January 2027 4.  Advised to stop all alcohol consumption 5.  Labs to calculate MELD score 6.  Elevated alkaline phosphatase we will recheck autoimmune markers, replace vitamin D and if it is still elevated in the future despite replacement of vitamin D and can consider liver biopsy if alkaline phosphatase does not remain stable.   Dr Wyline Mood  MD,MRCP [facility]) Follow up in 4-5 months

## 2022-09-27 LAB — COMPREHENSIVE METABOLIC PANEL
ALT: 18 IU/L (ref 0–44)
AST: 26 IU/L (ref 0–40)
Albumin: 4.2 g/dL (ref 3.8–4.8)
Alkaline Phosphatase: 197 IU/L — ABNORMAL HIGH (ref 44–121)
BUN/Creatinine Ratio: 30 — ABNORMAL HIGH (ref 10–24)
BUN: 31 mg/dL — ABNORMAL HIGH (ref 8–27)
Bilirubin Total: 4.2 mg/dL — ABNORMAL HIGH (ref 0.0–1.2)
CO2: 24 mmol/L (ref 20–29)
Calcium: 9.3 mg/dL (ref 8.6–10.2)
Chloride: 102 mmol/L (ref 96–106)
Creatinine, Ser: 1.04 mg/dL (ref 0.76–1.27)
Globulin, Total: 2.5 g/dL (ref 1.5–4.5)
Glucose: 60 mg/dL — ABNORMAL LOW (ref 70–99)
Potassium: 4.4 mmol/L (ref 3.5–5.2)
Sodium: 142 mmol/L (ref 134–144)
Total Protein: 6.7 g/dL (ref 6.0–8.5)
eGFR: 73 mL/min/{1.73_m2} (ref >59)

## 2022-09-27 LAB — CBC WITH DIFFERENTIAL/PLATELET
Basophils Absolute: 0 10*3/uL (ref 0.0–0.2)
Basos: 1 %
EOS (ABSOLUTE): 0.1 10*3/uL (ref 0.0–0.4)
Eos: 2 %
Hematocrit: 36.6 % — ABNORMAL LOW (ref 37.5–51.0)
Hemoglobin: 12.6 g/dL — ABNORMAL LOW (ref 13.0–17.7)
Immature Grans (Abs): 0 10*3/uL (ref 0.0–0.1)
Immature Granulocytes: 0 %
Lymphocytes Absolute: 1.2 10*3/uL (ref 0.7–3.1)
Lymphs: 21 %
MCH: 34.7 pg — ABNORMAL HIGH (ref 26.6–33.0)
MCHC: 34.4 g/dL (ref 31.5–35.7)
MCV: 101 fL — ABNORMAL HIGH (ref 79–97)
Monocytes Absolute: 0.7 10*3/uL (ref 0.1–0.9)
Monocytes: 13 %
Neutrophils Absolute: 3.6 10*3/uL (ref 1.4–7.0)
Neutrophils: 63 %
Platelets: 164 10*3/uL (ref 150–450)
RBC: 3.63 x10E6/uL — ABNORMAL LOW (ref 4.14–5.80)
RDW: 12.9 % (ref 11.6–15.4)
WBC: 5.6 10*3/uL (ref 3.4–10.8)

## 2022-09-27 LAB — PROTIME-INR
INR: 1.3 — ABNORMAL HIGH (ref 0.9–1.2)
Prothrombin Time: 14.1 s — ABNORMAL HIGH (ref 9.1–12.0)

## 2022-09-27 LAB — MITOCHONDRIAL/SMOOTH MUSCLE AB PNL
Mitochondrial Ab: <20 Units (ref 0.0–20.0)
Smooth Muscle Ab: 4 Units (ref 0–19)

## 2022-09-27 LAB — GAMMA GT: GGT: 196 IU/L — ABNORMAL HIGH (ref 0–65)

## 2022-09-29 DIAGNOSIS — G4733 Obstructive sleep apnea (adult) (pediatric): Secondary | ICD-10-CM | POA: Diagnosis not present

## 2022-10-03 ENCOUNTER — Ambulatory Visit (INDEPENDENT_AMBULATORY_CARE_PROVIDER_SITE_OTHER): Payer: Medicare PPO | Admitting: Nurse Practitioner

## 2022-10-03 ENCOUNTER — Encounter: Payer: Self-pay | Admitting: Nurse Practitioner

## 2022-10-03 VITALS — BP 126/72 | HR 51 | Temp 98.2°F | Ht 66.0 in | Wt 171.0 lb

## 2022-10-03 DIAGNOSIS — Z0000 Encounter for general adult medical examination without abnormal findings: Secondary | ICD-10-CM

## 2022-10-03 NOTE — Patient Instructions (Signed)
  Mr. Randy Montgomery , Thank you for taking time to come for your Medicare Wellness Visit. I appreciate your ongoing commitment to your health goals. Please review the following plan we discussed and let me know if I can assist you in the future.   These are the goals we discussed:  Goals      Patient Stated     To get back to exercise 3 times a week        This is a list of the screening recommended for you and due dates:  Health Maintenance  Topic Date Due   Flu Shot  08/03/2022   COVID-19 Vaccine (9 - 2023-24 season) 09/03/2022   Medicare Annual Wellness Visit  10/03/2023   DTaP/Tdap/Td vaccine (6 - Td or Tdap) 08/24/2032   Pneumonia Vaccine  Completed   Hepatitis C Screening  Completed   Zoster (Shingles) Vaccine  Completed   HPV Vaccine  Aged Out

## 2022-10-03 NOTE — Progress Notes (Signed)
Subjective:   Randy Montgomery is a 77 y.o. male who presents for Medicare Annual/Subsequent preventive examination.  Visit Complete: In person at [facility]   Cardiac Risk Factors include: advanced age (>58men, >26 women);hypertension;male gender     Objective:    Today's Vitals   10/03/22 0957  BP: 126/72  Pulse: (!) 51  Temp: 98.2 F (36.8 C)  SpO2: 97%  Weight: 171 lb (77.6 kg)  Height: 5\' 6"  (1.676 m)   Body mass index is 27.6 kg/m. Wt Readings from Last 3 Encounters:  10/03/22 171 lb (77.6 kg)  09/26/22 172 lb 6.4 oz (78.2 kg)  09/19/22 180 lb (81.6 kg)        10/03/2022   10:00 AM 09/19/2022   10:38 AM 09/14/2022   10:06 AM 07/25/2022    8:34 AM 06/20/2022   10:13 AM 12/15/2021   10:14 AM 11/22/2021    9:05 AM  Advanced Directives  Does Patient Have a Medical Advance Directive? Yes Yes Yes Yes Yes Yes Yes  Type of Estate agent of [place];Out of facility DNR (pink MOST or yellow form);Living will Healthcare Power of [place];Out of facility DNR (pink MOST or yellow form);Living will Healthcare Power of [place];Out of facility DNR (pink MOST or yellow form);Living will Healthcare Power of [place];Out of facility DNR (pink MOST or yellow form);Living will Healthcare Power of [place];Out of facility DNR (pink MOST or yellow form);Living will Healthcare Power of [place];Out of facility DNR (pink MOST or yellow form) Healthcare Power of [place];Living will  Does patient want to make changes to medical advance directive? No - Patient declined No - Patient declined No - Patient declined No - Patient declined No - Patient declined No - Patient declined   Copy of Healthcare Power of Attorney in Chart? Yes - validated most recent copy scanned in chart (See row information) Yes - validated most recent copy scanned in chart (See row information) Yes - validated most recent copy scanned in chart (See row information) Yes - validated most recent copy scanned  in chart (See row information) Yes - validated most recent copy scanned in chart (See row information) Yes - validated most recent copy scanned in chart (See row information)   Pre-existing out of facility DNR order (yellow form or pink MOST form)      Yellow form placed in chart (order not valid for inpatient use)     Current Medications (verified) Outpatient Encounter Medications as of 10/03/2022  Medication Sig   furosemide (LASIX) 40 MG tablet Take 1 tablet (40 mg total) by mouth daily.   metoprolol succinate (TOPROL-XL) 25 MG 24 hr tablet TAKE ONE TABLET BY MOUTH EVERY DAY   polyethylene glycol (MIRALAX / GLYCOLAX) 17 g packet Take 17 g by mouth as needed.   potassium chloride SA (KLOR-CON M) 20 MEQ tablet Take 1 tablet (20 mEq total) by mouth daily.   pravastatin (PRAVACHOL) 20 MG tablet TAKE ONE TABLET BY MOUTH EVERY DAY   tamsulosin (FLOMAX) 0.4 MG CAPS capsule TAKE 2 CAPSULES AT BEDTIME   tizanidine (ZANAFLEX) 2 MG capsule Take 1 capsule (2 mg total) by mouth 3 (three) times daily as needed for muscle spasms.   Vitamin D, Ergocalciferol, (DRISDOL) 1.25 MG (50000 UNIT) CAPS capsule Take 1 capsule (50,000 Units total) by mouth every 7 (seven) days.   XARELTO 20 MG TABS tablet TAKE ONE TABLET BY MOUTH EVERY DAY WITH EVENING MEAL   No facility-administered encounter medications on file as of 10/03/2022.  Allergies (verified) Patient has no known allergies.   History: Past Medical History:  Diagnosis Date   Atrial fibrillation [facility])    Per [facility] new patient packet   BPH (benign prostatic hyperplasia)    Dysrhythmia    Elevated alkaline phosphatase level    Elevated PSA    Enlarged prostate    High cholesterol    Hx of colonoscopy 2016   Per [facility] new patient packet   Hypertension    Sleep apnea    Per [facility] new patient packet   Slow urinary stream    Past Surgical History:  Procedure Laterality Date   COLONOSCOPY  07/03/2016   COLONOSCOPY WITH PROPOFOL N/A 10/06/2020    Procedure: COLONOSCOPY WITH PROPOFOL;  Surgeon: Wyline Mood, MD;  Location: [facility] ENDOSCOPY;  Service: Gastroenterology;  Laterality: N/A;   ESOPHAGOGASTRODUODENOSCOPY (EGD) WITH PROPOFOL N/A 11/22/2021   Procedure: ESOPHAGOGASTRODUODENOSCOPY (EGD) WITH PROPOFOL;  Surgeon: Wyline Mood, MD;  Location: [facility] ENDOSCOPY;  Service: Gastroenterology;  Laterality: N/A;   HERNIA REPAIR Right 2010   inguinal hernia    PROSTATE BIOPSY     TONSILLECTOMY     Family History  Problem Relation Age of Onset   Aneurysm Father    Heart attack Father    Heart disease Father    Social History   Socioeconomic History   Marital status: Married    Spouse name: Not on file   Number of children: Not on file   Years of education: Not on file   Highest education level: Bachelor's degree (e.g., BA, AB, BS)  Occupational History   Not on file  Tobacco Use   Smoking status: Never   Smokeless tobacco: Never  Vaping Use   Vaping status: Never Used  Substance and Sexual Activity   Alcohol use: Yes    Alcohol/week: 6.0 standard drinks of alcohol    Types: 6 Standard drinks or equivalent per week    Comment: [place], on the weekends   Drug use: Never   Sexual activity: Not on file  Other Topics Concern   Not on file  Social History Narrative   Diet      Do you drink/eat things with caffeine Yes      Marital Status Married  What year were you married? 1968      Do you live in a house, apartment, assisted living, condo, trailer, etc.? Assisted living      Is it one or more stories? 1      How many persons live in your home? 2         Do you have any pets in your home?(please list): No      Highest level of education completed: BA, [employer] 1967      Current or past profession: Financial planner      Do you exercise?: Moderately  Type and how often: [place], use wellnes equipment 2-3 times a week      Living Will?      DNR form? Yes    If not, do you wish to discuss one?       POA/HPOA forms? Yes       Difficulty bathing or dressing yourself? Patient did not answer these questions on new patient packet      Difficulty preparing food or eating?      Difficulty managing medications?      Difficulty managing your finances?      Difficulty affording your medications?  Social Determinants of Health   Financial Resource Strain: Low Risk  (07/24/2022)   Overall Financial Resource Strain (CARDIA)    Difficulty of Paying Living Expenses: Not hard at all  Food Insecurity: No Food Insecurity (07/24/2022)   Hunger Vital Sign    Worried About Running Out of Food in the Last Year: Never true    Ran Out of Food in the Last Year: Never true  Transportation Needs: No Transportation Needs (07/24/2022)   PRAPARE - Administrator, Civil Service (Medical): No    Lack of Transportation (Non-Medical): No  Physical Activity: Insufficiently Active (07/24/2022)   Exercise Vital Sign    Days of Exercise per Week: 3 days    Minutes of Exercise per Session: 30 min  Stress: No Stress Concern Present (07/24/2022)   [employer] of Occupational Health - Occupational Stress Questionnaire    Feeling of Stress : Not at all  Social Connections: Moderately Isolated (07/24/2022)   Social Connection and Isolation Panel [NHANES]    Frequency of Communication with Friends and Family: Once a week    Frequency of Social Gatherings with Friends and Family: Once a week    Attends Religious Services: Never    Database administrator or Organizations: Yes    Attends Engineer, structural: More than 4 times per year    Marital Status: Married    Tobacco Counseling Counseling given: Not Answered   Clinical Intake:  Pre-visit preparation completed: No  Pain : No/denies pain     BMI - recorded: 27.6 Nutritional Risks: Unintentional weight loss Diabetes: No  How often do you need to have someone help you when you read instructions, pamphlets, or other written materials  from your doctor or pharmacy?: 1 - Never         Activities of Daily Living    10/03/2022   10:13 AM 10/03/2022   10:01 AM  In your present state of health, do you have any difficulty performing the following activities:  Hearing?  1  Vision?  0  Difficulty concentrating or making decisions?  0  Walking or climbing stairs?  0  Dressing or bathing?  0  Doing errands, shopping?  0  Preparing Food and eating ?  N  Using the Toilet?  N  In the past six months, have you accidently leaked urine?  N  Do you have problems with loss of bowel control? N   Managing your Medications? N   Managing your Finances? N   Housekeeping or managing your Housekeeping? N     Patient Care Team: Sharon Seller, NP as PCP - General (Geriatric Medicine) Antonieta Iba, MD as PCP - Cardiology (Cardiology) Wyline Mood, MD as Consulting Physician (Gastroenterology)  Indicate any recent Medical Services you may have received from other than [facility] providers in the past year (date may be approximate).     Assessment:   This is a routine wellness examination for Dhanvin.  Hearing/Vision screen Vision Screening - Comments:: [facility] Last Exam: 04/2022   Goals Addressed             This Visit's Progress    Patient Stated       To get back to exercise 3 times a week      Depression Screen    10/03/2022    9:58 AM 07/25/2022    8:34 AM 06/20/2022   10:13 AM 12/15/2021   10:14 AM 09/15/2021   11:53 AM 09/09/2020  9:52 AM 07/24/2019   10:15 AM  PHQ 2/9 Scores  PHQ - 2 Score 0 0 0 0 0 0 0    Fall Risk    10/03/2022    9:58 AM 09/29/2022    2:10 PM 07/25/2022    8:34 AM 06/20/2022   10:13 AM 12/15/2021   10:14 AM  Fall Risk   Falls in the past year? 0 0 0 0 0  Number falls in past yr: 0  0 0 0  Injury with Fall? 0  0 0 0  Risk for fall due to :     No Fall Risks  Follow up     Falls evaluation completed    MEDICARE RISK AT HOME: Medicare Risk at Home Any stairs in or  around the home?: Yes If so, are there any without handrails?: No Home free of loose throw rugs in walkways, pet beds, electrical cords, etc?: Yes Adequate lighting in your home to reduce risk of falls?: Yes Life alert?: No Use of a cane, walker or w/c?: No Grab bars in the bathroom?: Yes Shower chair or bench in shower?: No Elevated toilet seat or a handicapped toilet?: Yes  TIMED UP AND GO:  Was the test performed?  No    Cognitive Function:    10/03/2022   10:02 AM  MMSE - Mini Mental State Exam  Not completed: --  Orientation to time 5  Orientation to Place 5  Registration 3  Attention/ Calculation 5  Recall 3  Language- name 2 objects 2  Language- repeat 1  Language- follow 3 step command 3  Language- read & follow direction 1  Write a sentence 1  Copy design 1  Total score 30        09/15/2021    1:35 PM 09/09/2020    9:55 AM  6CIT Screen  What Year? 0 points 0 points  What month? 0 points 0 points  What time? 0 points 0 points  Count back from 20 0 points 0 points  Months in reverse 0 points 2 points  Repeat phrase 2 points 0 points  Total Score 2 points 2 points    Immunizations Immunization History  Administered Date(s) Administered   Fluad Quad(high Dose 65+) 09/26/2021   Hep A / Hep B 01/16/2022, 06/29/2022   Hepatitis A 12/16/2021   Hepatitis B 12/16/2021   Influenza Split 10/24/2011   Influenza, High Dose Seasonal PF 10/01/2014, 09/22/2015, 10/14/2020   Influenza, Seasonal, Injecte, Preservative Fre 12/06/1999, 09/26/2012   Influenza,inj,Quad PF,6+ Mos 09/23/2013, 10/01/2014, 10/04/2016, 10/23/2017, 10/24/2017   Influenza-Unspecified 09/23/2013, 10/21/2019, 09/26/2021   Moderna Covid-19 Fall Seasonal Vaccine 20yrs & older 04/11/2022   Moderna Covid-19 Vaccine Bivalent Booster 85yrs & up 05/31/2021   Moderna Sars-Covid-2 Vaccination 01/16/2019, 02/13/2019, 11/18/2019, 05/20/2020   Pfizer Covid-19 Vaccine Bivalent Booster 65yrs & up 09/23/2020,  11/11/2021   Pneumococcal Conjugate-13 11/07/2012   Pneumococcal Polysaccharide-23 06/16/2008, 06/25/2009   Td 04/15/1992, 04/05/2002   Tdap 12/16/2010, 08/25/2022   Tetanus 04/05/2002   Unspecified SARS-COV-2 Vaccination 04/11/2022   Zoster Recombinant(Shingrix) 09/17/2017, 12/19/2017   Zoster, Live 06/10/2007    TDAP status: Up to date  Flu Vaccine status: Due, Education has been provided regarding the importance of this vaccine. Advised may receive this vaccine at local pharmacy or Health Dept. Aware to provide a copy of the vaccination record if obtained from local pharmacy or Health Dept. Verbalized acceptance and understanding.  Pneumococcal vaccine status: Up to date  Covid-19 vaccine status: Information provided  on how to obtain vaccines.   Qualifies for Shingles Vaccine? Yes   Zostavax completed No   Shingrix Completed?: Yes  Screening Tests Health Maintenance  Topic Date Due   INFLUENZA VACCINE  08/03/2022   COVID-19 Vaccine (9 - 2023-24 season) 09/03/2022   Medicare Annual Wellness (AWV)  10/03/2023   DTaP/Tdap/Td (6 - Td or Tdap) 08/24/2032   Pneumonia Vaccine 81+ Years old  Completed   Hepatitis C Screening  Completed   Zoster Vaccines- Shingrix  Completed   HPV VACCINES  Aged Out    Health Maintenance  Health Maintenance Due  Topic Date Due   INFLUENZA VACCINE  08/03/2022   COVID-19 Vaccine (9 - 2023-24 season) 09/03/2022    Colorectal cancer screening: No longer required.   Lung Cancer Screening: (Low Dose CT Chest recommended if Age 46-80 years, 20 pack-year currently smoking OR have quit w/in 15years.) does not qualify.   Lung Cancer Screening Referral: na  Additional Screening:  Hepatitis C Screening: does qualify; Completed   Vision Screening: Recommended annual ophthalmology exams for early detection of glaucoma and other disorders of the eye. Is the patient up to date with their annual eye exam?  Yes  Who is the provider or what is the name  of the office in which the patient attends annual eye exams? [facility] If pt is not established with a provider, would they like to be referred to a provider to establish care? No .   Dental Screening: Recommended annual dental exams for proper oral hygiene   Community Resource Referral / Chronic Care Management: CRR required this visit?  No   CCM required this visit?  No     Plan:     I have personally reviewed and noted the following in the patient's chart:   Medical and social history Use of alcohol, tobacco or illicit drugs  Current medications and supplements including opioid prescriptions. Patient is not currently taking opioid prescriptions. Functional ability and status Nutritional status Physical activity Advanced directives List of other physicians Hospitalizations, surgeries, and ER visits in previous 12 months Vitals Screenings to include cognitive, depression, and falls Referrals and appointments  In addition, I have reviewed and discussed with patient certain preventive protocols, quality metrics, and best practice recommendations. A written personalized care plan for preventive services as well as general preventive health recommendations were provided to patient.     Sharon Seller, NP   10/03/2022

## 2022-10-04 ENCOUNTER — Ambulatory Visit: Payer: Medicare PPO | Admitting: Surgery

## 2022-10-05 ENCOUNTER — Telehealth: Payer: Self-pay | Admitting: Nurse Practitioner

## 2022-10-05 NOTE — Telephone Encounter (Signed)
He was referred to surgery due to hernia, received an appt then appt was cancelled the same day due to his complex medical history. It was recommended he be referred to another general surgeon-will send new referral to [facility]. Please call patient to make sure he is onboard with this- vs going to [place] or [place].

## 2022-10-05 NOTE — Telephone Encounter (Signed)
Left message on voicemail for patient to return call when available   

## 2022-10-06 ENCOUNTER — Other Ambulatory Visit: Payer: Self-pay | Admitting: Nurse Practitioner

## 2022-10-06 DIAGNOSIS — J90 Pleural effusion, not elsewhere classified: Secondary | ICD-10-CM

## 2022-10-13 ENCOUNTER — Ambulatory Visit
Admission: RE | Admit: 2022-10-13 | Discharge: 2022-10-13 | Disposition: A | Discharge status: Home / Self Care | Payer: Medicare PPO | Source: Ambulatory Visit | Attending: Nurse Practitioner | Admitting: Nurse Practitioner

## 2022-10-13 DIAGNOSIS — J90 Pleural effusion, not elsewhere classified: Secondary | ICD-10-CM | POA: Diagnosis not present

## 2022-10-23 DIAGNOSIS — K4090 Unilateral inguinal hernia, without obstruction or gangrene, not specified as recurrent: Secondary | ICD-10-CM | POA: Diagnosis not present

## 2022-10-23 DIAGNOSIS — K7031 Alcoholic cirrhosis of liver with ascites: Secondary | ICD-10-CM | POA: Diagnosis not present

## 2022-10-26 ENCOUNTER — Other Ambulatory Visit: Payer: Self-pay | Admitting: Nurse Practitioner

## 2022-10-30 ENCOUNTER — Encounter: Payer: Self-pay | Admitting: Nurse Practitioner

## 2022-11-06 ENCOUNTER — Telehealth: Payer: Self-pay

## 2022-11-06 ENCOUNTER — Ambulatory Visit
Admission: RE | Admit: 2022-11-06 | Discharge: 2022-11-06 | Disposition: A | Discharge status: Home / Self Care | Payer: Medicare PPO | Source: Ambulatory Visit | Attending: Gastroenterology | Admitting: Gastroenterology

## 2022-11-06 ENCOUNTER — Other Ambulatory Visit: Payer: Self-pay | Admitting: Nurse Practitioner

## 2022-11-06 DIAGNOSIS — R188 Other ascites: Secondary | ICD-10-CM | POA: Diagnosis not present

## 2022-11-06 DIAGNOSIS — K7469 Other cirrhosis of liver: Secondary | ICD-10-CM | POA: Insufficient documentation

## 2022-11-06 DIAGNOSIS — J90 Pleural effusion, not elsewhere classified: Secondary | ICD-10-CM

## 2022-11-06 DIAGNOSIS — R748 Abnormal levels of other serum enzymes: Secondary | ICD-10-CM

## 2022-11-06 DIAGNOSIS — K7689 Other specified diseases of liver: Secondary | ICD-10-CM | POA: Diagnosis not present

## 2022-11-06 DIAGNOSIS — K7460 Unspecified cirrhosis of liver: Secondary | ICD-10-CM | POA: Diagnosis not present

## 2022-11-06 NOTE — Telephone Encounter (Addendum)
Called patient but had to leave him a detailed voicemail letting him know that his Alkaline Phosphatase was  elevated and that Dr. Tobi Bastos wanted him to get a MRCP MRI of the liver/abdomen to make sure that everything is doing well. I left patient my information to call me back.  No liver mass  Ascites seen check what dose of aldactone and lasix is he on  Advise low salt diet  Repeat USG RUQ in 6 months (booked already)

## 2022-11-06 NOTE — Telephone Encounter (Signed)
-----   Message from Wyline Mood sent at 11/06/2022 10:16 AM EST ----- Alk phos elevated - suggest MRCP MRI of liver/abdomen

## 2022-11-06 NOTE — Telephone Encounter (Signed)
I will also let him know via MyChart and see which way he would reply back.

## 2022-11-10 NOTE — Telephone Encounter (Signed)
Called patient back and left him a detailed message letting him know that I saw that he had read my message through MyChart but he had not responded back. I let him know to call us back to let us know as soon as he could. I will now send him a letter. Hopefully he calls [place] back to let us know if he would schedule the MRCP or not.

## 2022-11-13 ENCOUNTER — Encounter: Payer: Self-pay | Admitting: Nurse Practitioner

## 2022-11-15 NOTE — Telephone Encounter (Signed)
Looked into patient's chart and saw that he had read his message in MyChart but did not reply back to my question about scheduling him the MRCP per Dr. Johnney Killian request.

## 2022-11-16 DIAGNOSIS — G4733 Obstructive sleep apnea (adult) (pediatric): Secondary | ICD-10-CM | POA: Diagnosis not present

## 2022-11-22 ENCOUNTER — Ambulatory Visit: Payer: Medicare PPO | Admitting: Dermatology

## 2022-11-22 DIAGNOSIS — Z1283 Encounter for screening for malignant neoplasm of skin: Secondary | ICD-10-CM

## 2022-11-22 DIAGNOSIS — R238 Other skin changes: Secondary | ICD-10-CM

## 2022-11-22 DIAGNOSIS — L578 Other skin changes due to chronic exposure to nonionizing radiation: Secondary | ICD-10-CM | POA: Diagnosis not present

## 2022-11-22 DIAGNOSIS — I872 Venous insufficiency (chronic) (peripheral): Secondary | ICD-10-CM

## 2022-11-22 DIAGNOSIS — L821 Other seborrheic keratosis: Secondary | ICD-10-CM | POA: Diagnosis not present

## 2022-11-22 DIAGNOSIS — W908XXA Exposure to other nonionizing radiation, initial encounter: Secondary | ICD-10-CM | POA: Diagnosis not present

## 2022-11-22 DIAGNOSIS — D1801 Hemangioma of skin and subcutaneous tissue: Secondary | ICD-10-CM | POA: Diagnosis not present

## 2022-11-22 DIAGNOSIS — L820 Inflamed seborrheic keratosis: Secondary | ICD-10-CM

## 2022-11-22 DIAGNOSIS — L570 Actinic keratosis: Secondary | ICD-10-CM

## 2022-11-22 DIAGNOSIS — L817 Pigmented purpuric dermatosis: Secondary | ICD-10-CM

## 2022-11-22 DIAGNOSIS — L853 Xerosis cutis: Secondary | ICD-10-CM

## 2022-11-22 DIAGNOSIS — D229 Melanocytic nevi, unspecified: Secondary | ICD-10-CM

## 2022-11-22 DIAGNOSIS — L814 Other melanin hyperpigmentation: Secondary | ICD-10-CM

## 2022-11-22 DIAGNOSIS — Z872 Personal history of diseases of the skin and subcutaneous tissue: Secondary | ICD-10-CM

## 2022-11-22 NOTE — Progress Notes (Signed)
Follow-Up Visit   Subjective  Randy Montgomery is a 77 y.o. male who presents for the following: Skin Cancer Screening and Full Body Skin Exam hx of Aks, itchy spots trunk, otc lotion, check spot R lat canthus  The patient presents for Total-Body Skin Exam (TBSE) for skin cancer screening and mole check. The patient has spots, moles and lesions to be evaluated, some may be new or changing and the patient may have concern these could be cancer.    The following portions of the chart were reviewed this encounter and updated as appropriate: medications, allergies, medical history  Review of Systems:  No other skin or systemic complaints except as noted in HPI or Assessment and Plan.  Objective  Well appearing patient in no apparent distress; mood and affect are within normal limits.  A full examination was performed including scalp, head, eyes, ears, nose, lips, neck, chest, axillae, abdomen, back, buttocks, bilateral upper extremities, bilateral lower extremities, hands, feet, fingers, toes, fingernails, and toenails. All findings within normal limits unless otherwise noted below.   Relevant physical exam findings are noted in the Assessment and Plan.  scalp x 1 Pink scaly macules  post scalp x 1 Stuck on waxy paps with erythema  R lat canthus upper eyelid x 1 Stuck on waxy paps with erythema .     Assessment & Plan   SKIN CANCER SCREENING PERFORMED TODAY.  ACTINIC DAMAGE - Chronic condition, secondary to cumulative UV/sun exposure - diffuse scaly erythematous macules with underlying dyspigmentation - Recommend daily broad spectrum sunscreen SPF 30+ to sun-exposed areas, reapply every 2 hours as needed.  - Staying in the shade or wearing long sleeves, sun glasses (UVA+UVB protection) and wide brim hats (4-inch brim around the entire circumference of the hat) are also recommended for sun protection.  - Call for new or changing lesions.  LENTIGINES, SEBORRHEIC KERATOSES,  HEMANGIOMAS - Benign normal skin lesions - Benign-appearing - Call for any changes  MELANOCYTIC NEVI - Tan-brown and/or pink-flesh-colored symmetric macules and papules - Benign appearing on exam today - Observation - Call clinic for new or changing moles - Recommend daily use of broad spectrum spf 30+ sunscreen to sun-exposed areas.   Xerosis - diffuse xerotic patches - recommend gentle, hydrating skin care - gentle skin care handout given - Start Eucerin with Urea  STASIS CHANGES with SCHAMBERG'S PURPURA Exam: Erythematous, scaly patches involving the ankle and distal lower leg with associated lower leg edema.  Chronic and persistent condition with duration or expected duration over one year. Condition is symptomatic / bothersome to patient. Not to goal.   Stasis in the legs causes chronic leg swelling, which may result in itchy or painful rashes, skin discoloration, skin texture changes, and sometimes ulceration.  Recommend daily graduated compression hose/stockings- easiest to put on first thing in morning, remove at bedtime.  Elevate legs as much as possible. Avoid salt/sodium rich foods.  Treatment Plan: Cont Compression socks qd   AK (actinic keratosis) scalp x 1  Actinic keratoses are precancerous spots that appear secondary to cumulative UV radiation exposure/sun exposure over time. They are chronic with expected duration over 1 year. A portion of actinic keratoses will progress to squamous cell carcinoma of the skin. It is not possible to reliably predict which spots will progress to skin cancer and so treatment is recommended to prevent development of skin cancer.  Recommend daily broad spectrum sunscreen SPF 30+ to sun-exposed areas, reapply every 2 hours as needed.  Recommend staying  in the shade or wearing long sleeves, sun glasses (UVA+UVB protection) and wide brim hats (4-inch brim around the entire circumference of the hat). Call for new or changing  lesions.  Destruction of lesion - scalp x 1 Complexity: simple   Destruction method: cryotherapy   Informed consent: discussed and consent obtained   Timeout:  patient name, date of birth, surgical site, and procedure verified Lesion destroyed using liquid nitrogen: Yes   Region frozen until ice ball extended beyond lesion: Yes   Outcome: patient tolerated procedure well with no complications   Post-procedure details: wound care instructions given    Inflamed seborrheic keratosis post scalp x 1  Symptomatic, irritating, patient would like treated.  Destruction of lesion - post scalp x 1 Complexity: simple   Destruction method: cryotherapy   Informed consent: discussed and consent obtained   Timeout:  patient name, date of birth, surgical site, and procedure verified Lesion destroyed using liquid nitrogen: Yes   Region frozen until ice ball extended beyond lesion: Yes   Outcome: patient tolerated procedure well with no complications   Post-procedure details: wound care instructions given    Seborrheic keratosis, inflamed R lat canthus upper eyelid margin x 1  Symptomatic, irritating, patient would like treated.   Destruction of lesion - R lat canthus upper eyelid x 1 Complexity: simple   Destruction method: cryotherapy   Informed consent: discussed and consent obtained   Timeout:  patient name, date of birth, surgical site, and procedure verified Lesion destroyed using liquid nitrogen: Yes   Region frozen until ice ball extended beyond lesion: Yes   Outcome: patient tolerated procedure well with no complications   Post-procedure details: wound care instructions given     Return in about 1 year (around 11/22/2023) for TBSE, Hx of AKs.  I, Ardis Rowan, RMA, am acting as scribe for Armida Sans, MD .   Documentation: I have reviewed the above documentation for accuracy and completeness, and I agree with the above.  Armida Sans, MD

## 2022-11-22 NOTE — Patient Instructions (Addendum)

## 2022-11-23 ENCOUNTER — Encounter: Payer: Self-pay | Admitting: Nurse Practitioner

## 2022-11-23 NOTE — Telephone Encounter (Signed)
Immunization has been added to the chart

## 2022-11-24 ENCOUNTER — Ambulatory Visit
Admission: RE | Admit: 2022-11-24 | Discharge: 2022-11-24 | Disposition: A | Discharge status: Home / Self Care | Payer: Medicare PPO | Source: Ambulatory Visit | Attending: Pulmonary Disease | Admitting: Pulmonary Disease

## 2022-11-24 ENCOUNTER — Ambulatory Visit: Payer: Medicare PPO | Admitting: Pulmonary Disease

## 2022-11-24 ENCOUNTER — Encounter: Payer: Self-pay | Admitting: Pulmonary Disease

## 2022-11-24 VITALS — BP 100/56 | HR 56 | Temp 97.8°F | Ht 66.0 in | Wt 170.0 lb

## 2022-11-24 DIAGNOSIS — R918 Other nonspecific abnormal finding of lung field: Secondary | ICD-10-CM | POA: Diagnosis not present

## 2022-11-24 DIAGNOSIS — J90 Pleural effusion, not elsewhere classified: Secondary | ICD-10-CM | POA: Diagnosis not present

## 2022-11-24 DIAGNOSIS — R0989 Other specified symptoms and signs involving the circulatory and respiratory systems: Secondary | ICD-10-CM | POA: Diagnosis not present

## 2022-11-24 DIAGNOSIS — I517 Cardiomegaly: Secondary | ICD-10-CM | POA: Diagnosis not present

## 2022-11-24 NOTE — Progress Notes (Signed)
Synopsis: Referred in by Sharon Seller, NP   Subjective:   PATIENT ID: Randy Montgomery GENDER: male DOB: 1945-05-18, MRN: [id number]  Chief Complaint  Patient presents with   Consult    Cough. Shortness of breath on exertion.      HPI Randy Montgomery is a pleasant 77 year old male patient with a past medical history of alcoholic liver cirrhosis presenting today to the pulmonary clinic for evaluation of right pleural effusion.  He was initially in diagnosis with liver cirrhosis in 2023 and recent abdominal ultrasound in 2024 showed cirrhosis of the liver with ascites.  He underwent a CT abdomen pelvis without contrast in September 2024 that also showed a moderate size right pleural effusion.  The latter was not present on the CT abdomen pelvis done in 2023.  Subsequently, he was started on Lasix 40 mg p.o. daily in September 2024.  He is diuresing well and consistently.  He denies any shortness of breath, chest tightness or wheezing. He denies any changes to his appetite.   Family history - He denies any family histo   Social history - Never smoker.    ROS All systems were reviewed and are negative except for the above.  Objective:   Vitals:   11/24/22 1121  BP: (!) 100/56  Pulse: (!) 56  Temp: 97.8 F (36.6 C)  TempSrc: Temporal  SpO2: 97%  Weight: 170 lb (77.1 kg)  Height: 5\' 6"  (1.676 m)   97% on RA BMI Readings from Last 3 Encounters:  11/24/22 27.44 kg/m  10/03/22 27.60 kg/m  09/26/22 27.83 kg/m   Wt Readings from Last 3 Encounters:  11/24/22 170 lb (77.1 kg)  10/03/22 171 lb (77.6 kg)  09/26/22 172 lb 6.4 oz (78.2 kg)    Physical Exam GEN: NAD HEENT: Supple Neck, Reactive Pupils, EOMI  CVS: Normal S1, Normal S2, RRR, No murmurs or ES appreciated  Lungs: Clear bilateral air entry. Decreased breath sounds over the right hemithorax. Abdomen: Soft, non tender, non distended, + BS  Extremities: Warm and well perfused, No edema  Skin: No suspicious  lesions appreciated  Psych: Normal Affect  Labs and imaging were reviewed.  Ancillary Information   CBC    Component Value Date/Time   WBC 5.6 09/26/2022 1430   RBC 3.63 (L) 09/26/2022 1430   RBC 3.85 (A) 12/19/2021 0000   HGB 12.6 (L) 09/26/2022 1430   HCT 36.6 (L) 09/26/2022 1430   PLT 164 09/26/2022 1430   MCV 101 (H) 09/26/2022 1430   MCH 34.7 (H) 09/26/2022 1430   MCHC 34.4 09/26/2022 1430   RDW 12.9 09/26/2022 1430   LYMPHSABS 1.2 09/26/2022 1430   EOSABS 0.1 09/26/2022 1430   BASOSABS 0.0 09/26/2022 1430       No data to display           Assessment & Plan:  Randy Montgomery is a pleasant 77 year old male patient with a past medical history of alcoholic liver cirrhosis presenting today to the pulmonary clinic for evaluation of right pleural effusion.  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 Currently on Furosemide 40mg  PO Daily. Asymptomatic from a respiratory stand point. Can consider adding spirinolactone. Will obtain a CXR today and follow up with him in 4 weeks. If effusion is still persistent after adequate diuresis, we ll obtain a diagnostic thoracentesis.   RTC 4 weeks.   I spent 60 minutes caring for this patient today, including  preparing to see the patient, obtaining a medical history , reviewing a separately obtained history, performing a medically appropriate examination and/or evaluation, counseling and educating the patient/family/caregiver, ordering medications, tests, or procedures, documenting clinical information in the electronic health record, and independently interpreting results (not separately reported/billed) and communicating results to the patient/family/caregiver  Janann Colonel, MD [facility] 11/24/2022 2:17 PM

## 2022-11-28 ENCOUNTER — Telehealth: Payer: Self-pay | Admitting: Pulmonary Disease

## 2022-11-28 ENCOUNTER — Encounter: Payer: Self-pay | Admitting: Dermatology

## 2022-11-28 ENCOUNTER — Encounter: Payer: Self-pay | Admitting: Nurse Practitioner

## 2022-11-28 NOTE — Telephone Encounter (Signed)
Wants to know if he needs to still be seen in 4 weeks

## 2022-11-28 NOTE — Telephone Encounter (Signed)
Patient advised that Dr. Larinda Buttery does want to see him in 4 weeks. Appt scheduled. Nothing further needed.

## 2022-12-04 ENCOUNTER — Other Ambulatory Visit: Payer: Self-pay

## 2022-12-04 DIAGNOSIS — R748 Abnormal levels of other serum enzymes: Secondary | ICD-10-CM

## 2022-12-06 ENCOUNTER — Other Ambulatory Visit: Payer: Self-pay | Admitting: Nurse Practitioner

## 2022-12-07 ENCOUNTER — Other Ambulatory Visit: Payer: Self-pay | Admitting: Gastroenterology

## 2022-12-07 ENCOUNTER — Ambulatory Visit
Admission: RE | Admit: 2022-12-07 | Discharge: 2022-12-07 | Disposition: A | Discharge status: Home / Self Care | Payer: Medicare PPO | Source: Ambulatory Visit | Attending: Gastroenterology | Admitting: Gastroenterology

## 2022-12-07 DIAGNOSIS — K7460 Unspecified cirrhosis of liver: Secondary | ICD-10-CM | POA: Diagnosis not present

## 2022-12-07 DIAGNOSIS — R188 Other ascites: Secondary | ICD-10-CM | POA: Diagnosis not present

## 2022-12-07 DIAGNOSIS — K5730 Diverticulosis of large intestine without perforation or abscess without bleeding: Secondary | ICD-10-CM | POA: Diagnosis not present

## 2022-12-07 DIAGNOSIS — N281 Cyst of kidney, acquired: Secondary | ICD-10-CM | POA: Diagnosis not present

## 2022-12-07 DIAGNOSIS — R748 Abnormal levels of other serum enzymes: Secondary | ICD-10-CM

## 2022-12-07 MED ORDER — GADOBUTROL 1 MMOL/ML IV SOLN
7.5000 mL | Freq: Once | INTRAVENOUS | Status: AC | PRN
  Administered 2022-12-07: 7.5 mL via INTRAVENOUS

## 2022-12-14 ENCOUNTER — Other Ambulatory Visit: Payer: Self-pay

## 2022-12-14 DIAGNOSIS — G4733 Obstructive sleep apnea (adult) (pediatric): Secondary | ICD-10-CM | POA: Diagnosis not present

## 2022-12-14 DIAGNOSIS — K7469 Other cirrhosis of liver: Secondary | ICD-10-CM

## 2022-12-14 DIAGNOSIS — R188 Other ascites: Secondary | ICD-10-CM

## 2022-12-14 DIAGNOSIS — R748 Abnormal levels of other serum enzymes: Secondary | ICD-10-CM

## 2022-12-14 MED ORDER — SPIRONOLACTONE 100 MG PO TABS: 100.0000 mg | ORAL_TABLET | Freq: Every day | ORAL | 3 refills | Status: DC

## 2022-12-19 ENCOUNTER — Other Ambulatory Visit: Payer: Self-pay | Admitting: Nurse Practitioner

## 2022-12-20 ENCOUNTER — Other Ambulatory Visit: Payer: Self-pay | Admitting: Nurse Practitioner

## 2022-12-20 DIAGNOSIS — G4733 Obstructive sleep apnea (adult) (pediatric): Secondary | ICD-10-CM | POA: Diagnosis not present

## 2022-12-20 DIAGNOSIS — R600 Localized edema: Secondary | ICD-10-CM

## 2022-12-21 DIAGNOSIS — K7469 Other cirrhosis of liver: Secondary | ICD-10-CM | POA: Diagnosis not present

## 2022-12-21 DIAGNOSIS — R748 Abnormal levels of other serum enzymes: Secondary | ICD-10-CM | POA: Diagnosis not present

## 2022-12-21 DIAGNOSIS — R188 Other ascites: Secondary | ICD-10-CM | POA: Diagnosis not present

## 2022-12-22 ENCOUNTER — Encounter: Payer: Self-pay | Admitting: Pulmonary Disease

## 2022-12-22 ENCOUNTER — Other Ambulatory Visit: Payer: Self-pay

## 2022-12-22 ENCOUNTER — Ambulatory Visit: Payer: Medicare PPO | Admitting: Pulmonary Disease

## 2022-12-22 ENCOUNTER — Encounter: Payer: Self-pay | Admitting: Gastroenterology

## 2022-12-22 VITALS — BP 122/74 | HR 80 | Temp 97.8°F | Ht 67.0 in | Wt 164.8 lb

## 2022-12-22 DIAGNOSIS — R748 Abnormal levels of other serum enzymes: Secondary | ICD-10-CM

## 2022-12-22 DIAGNOSIS — K7469 Other cirrhosis of liver: Secondary | ICD-10-CM

## 2022-12-22 DIAGNOSIS — J90 Pleural effusion, not elsewhere classified: Secondary | ICD-10-CM | POA: Diagnosis not present

## 2022-12-22 LAB — BASIC METABOLIC PANEL
BUN/Creatinine Ratio: 25 — ABNORMAL HIGH (ref 10–24)
BUN: 27 mg/dL (ref 8–27)
CO2: 23 mmol/L (ref 20–29)
Calcium: 9.6 mg/dL (ref 8.6–10.2)
Chloride: 101 mmol/L (ref 96–106)
Creatinine, Ser: 1.08 mg/dL (ref 0.76–1.27)
Glucose: 90 mg/dL (ref 70–99)
Potassium: 4.3 mmol/L (ref 3.5–5.2)
Sodium: 139 mmol/L (ref 134–144)
eGFR: 70 mL/min/{1.73_m2} (ref >59)

## 2022-12-22 NOTE — Progress Notes (Signed)
Synopsis: Referred in by Sharon Seller, NP   Subjective:   PATIENT ID: Randy Montgomery GENDER: male DOB: Aug 10, 1945, MRN: [id number]  Chief Complaint  Patient presents with   Follow-up    Mild SOB with exertion.     HPI Randy Montgomery is a pleasant 77 year old male patient with a past medical history of alcoholic liver cirrhosis presenting today to the pulmonary clinic for evaluation of right pleural effusion.  He was initially in diagnosis with liver cirrhosis in 2023 and recent abdominal ultrasound in 2024 showed cirrhosis of the liver with ascites.  He underwent a CT abdomen pelvis without contrast in September 2024 that also showed a moderate size right pleural effusion.  The latter was not present on the CT abdomen pelvis done in 2023.  Subsequently, he was started on Lasix 40 mg p.o. daily in September 2024.  He is diuresing well and consistently.  He denies any shortness of breath, chest tightness or wheezing. He denies any changes to his appetite.   MRI abdomen 12/2022 with liver cirrhosis and moderate ascites. Started on Spirinolactone 100mg  daily by GI.   Family history - He denies any family histo   Social history - Never smoker.    ROS All systems were reviewed and are negative except for the above.  Objective:   Vitals:   12/22/22 1029  BP: 122/74  Pulse: 80  Temp: 97.8 F (36.6 C)  TempSrc: Temporal  SpO2: 94%  Weight: 164 lb 12.8 oz (74.8 kg)  Height: 5\' 7"  (1.702 m)   94% on RA BMI Readings from Last 3 Encounters:  12/22/22 25.81 kg/m  11/24/22 27.44 kg/m  10/03/22 27.60 kg/m   Wt Readings from Last 3 Encounters:  12/22/22 164 lb 12.8 oz (74.8 kg)  11/24/22 170 lb (77.1 kg)  10/03/22 171 lb (77.6 kg)    Physical Exam GEN: NAD HEENT: Supple Neck, Reactive Pupils, EOMI  CVS: Normal S1, Normal S2, RRR, No murmurs or ES appreciated  Lungs: Clear bilateral air entry. Decreased breath sounds over the right hemithorax. Abdomen: Soft, non  tender, non distended, + BS  Extremities: Warm and well perfused, No edema  Skin: No suspicious lesions appreciated  Psych: Normal Affect  Labs and imaging were reviewed.  Ancillary Information   CBC    Component Value Date/Time   WBC 5.6 09/26/2022 1430   RBC 3.63 (L) 09/26/2022 1430   RBC 3.85 (A) 12/19/2021 0000   HGB 12.6 (L) 09/26/2022 1430   HCT 36.6 (L) 09/26/2022 1430   PLT 164 09/26/2022 1430   MCV 101 (H) 09/26/2022 1430   MCH 34.7 (H) 09/26/2022 1430   MCHC 34.4 09/26/2022 1430   RDW 12.9 09/26/2022 1430   LYMPHSABS 1.2 09/26/2022 1430   EOSABS 0.1 09/26/2022 1430   BASOSABS 0.0 09/26/2022 1430       No data to display           Assessment & Plan:  Randy Montgomery is a pleasant 77 year old male patient with a past medical history of alcoholic liver cirrhosis presenting today to the pulmonary clinic for evaluation of right pleural effusion.  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 Currently on Furosemide 40mg  PO Daily and started on spirinolactone recently by GI. [place] in office with only minimal effusion, anechoic non amenable for thoracentesis. Asymptomatic from a respiratory stand point.   RTC PRN  I spent 40 minutes caring for this patient today,  including preparing to see the patient, obtaining a medical history , reviewing a separately obtained history, performing a medically appropriate examination and/or evaluation, counseling and educating the patient/family/caregiver, ordering medications, tests, or procedures, documenting clinical information in the electronic health record, and independently interpreting results (not separately reported/billed) and communicating results to the patient/family/caregiver  Janann Colonel, MD [facility] 12/22/2022 10:50 AM      Chest Point of Care Ultrasound  Patient name: Randy Montgomery MRN: [id number] DOB:  [birth year]  Indication: R pleural effusion.   We performed ultrasound of Right Entire hemithorax,for assessment of presence, volume and sonographic appearance of pleural fluid and sonographic apperance of lung in preparation for sampling of pleural fluid/insertion of catheter into pleural space.  Relevant sonographic findings:  Trace amount of pleural fluid noted on isonation. Sonographic appearance of fluid was homogeneously anechoic. Visualized lung parenchyma was noted to be moving freely with respiration.  Impression:  Trace pleural effusion that is simple in nature.  No further sonographic surveillance needed.  CPT code 40981

## 2022-12-25 ENCOUNTER — Other Ambulatory Visit: Payer: Self-pay | Admitting: Nurse Practitioner

## 2023-01-02 ENCOUNTER — Ambulatory Visit: Payer: Medicare PPO | Admitting: Nurse Practitioner

## 2023-01-02 ENCOUNTER — Encounter: Payer: Self-pay | Admitting: Nurse Practitioner

## 2023-01-02 VITALS — BP 126/74 | HR 56 | Temp 97.7°F | Ht 67.0 in | Wt 158.0 lb

## 2023-01-02 DIAGNOSIS — K7469 Other cirrhosis of liver: Secondary | ICD-10-CM | POA: Diagnosis not present

## 2023-01-02 DIAGNOSIS — K4090 Unilateral inguinal hernia, without obstruction or gangrene, not specified as recurrent: Secondary | ICD-10-CM

## 2023-01-02 DIAGNOSIS — I482 Chronic atrial fibrillation, unspecified: Secondary | ICD-10-CM

## 2023-01-02 DIAGNOSIS — N401 Enlarged prostate with lower urinary tract symptoms: Secondary | ICD-10-CM | POA: Diagnosis not present

## 2023-01-02 DIAGNOSIS — D649 Anemia, unspecified: Secondary | ICD-10-CM | POA: Diagnosis not present

## 2023-01-02 DIAGNOSIS — E782 Mixed hyperlipidemia: Secondary | ICD-10-CM | POA: Diagnosis not present

## 2023-01-02 DIAGNOSIS — R350 Frequency of micturition: Secondary | ICD-10-CM | POA: Diagnosis not present

## 2023-01-02 DIAGNOSIS — R600 Localized edema: Secondary | ICD-10-CM

## 2023-01-02 DIAGNOSIS — J90 Pleural effusion, not elsewhere classified: Secondary | ICD-10-CM

## 2023-01-02 NOTE — Progress Notes (Signed)
 Careteam: Patient Care Team: Caro Harlene POUR, NP as PCP - General (Geriatric Medicine) Perla Evalene PARAS, MD as PCP - Cardiology (Cardiology) Therisa Bi, MD as Consulting Physician (Gastroenterology)  Advanced Directive information Does Patient Have a Medical Advance Directive?: Yes, Type of Advance Directive: Healthcare Power of Gough;Out of facility DNR (pink MOST or yellow form);Living will, Does patient want to make changes to medical advance directive?: No - Patient declined  No Known Allergies  Chief Complaint  Patient presents with   Medical Management of Chronic Issues    Medical Management of Chronic Issues.      HPI: Patient is a 77 y.o. male seen in today at the [facility] for routine follow up.   Following with GI due to liver cirrhosis, he has been changed to aldactone  and weighing daily. He has been on since 12/13 weight went from 162 lbs to 158 lbs.  He reports he continues to eat well.  Did have a glass of wine at christmas dinner otherwise not drinking any alcohol.  Swelling has significantly improved.   Saw pulmonary and effusion has improved on recent follow up- overall breathing better  Has lab due later this week.    Review of Systems:  Review of Systems  Constitutional:  Negative for chills, fever and weight loss.  HENT:  Negative for tinnitus.   Respiratory:  Negative for cough, sputum production and shortness of breath.   Cardiovascular:  Negative for chest pain, palpitations and leg swelling.  Gastrointestinal:  Negative for abdominal pain, constipation, diarrhea and heartburn.  Genitourinary:  Negative for dysuria, frequency and urgency.  Musculoskeletal:  Negative for back pain, falls, joint pain and myalgias.  Skin: Negative.   Neurological:  Negative for dizziness and headaches.  Psychiatric/Behavioral:  Negative for depression and memory loss. The patient does not have insomnia.     Past Medical History:  Diagnosis Date    Atrial fibrillation [facility])    Per [facility] new patient packet   BPH (benign prostatic hyperplasia)    Dysrhythmia    Elevated alkaline phosphatase level    Elevated PSA    Enlarged prostate    High cholesterol    Hx of colonoscopy 2016   Per [facility] new patient packet   Hypertension    Sleep apnea    Per [facility] new patient packet   Slow urinary stream    Past Surgical History:  Procedure Laterality Date   COLONOSCOPY  07/03/2016   COLONOSCOPY WITH PROPOFOL  N/A 10/06/2020   Procedure: COLONOSCOPY WITH PROPOFOL ;  Surgeon: Therisa Bi, MD;  Location: [facility] ENDOSCOPY;  Service: Gastroenterology;  Laterality: N/A;   ESOPHAGOGASTRODUODENOSCOPY (EGD) WITH PROPOFOL  N/A 11/22/2021   Procedure: ESOPHAGOGASTRODUODENOSCOPY (EGD) WITH PROPOFOL ;  Surgeon: Therisa Bi, MD;  Location: [facility] ENDOSCOPY;  Service: Gastroenterology;  Laterality: N/A;   HERNIA REPAIR Right 2010   inguinal hernia    PROSTATE BIOPSY     TONSILLECTOMY     Social History:   reports that he has never smoked. He has never used smokeless tobacco. He reports current alcohol use of about 6.0 standard drinks of alcohol per week. He reports that he does not use drugs.  Family History  Problem Relation Age of Onset   Aneurysm Father    Heart attack Father    Heart disease Father     Medications: Patient's Medications  New Prescriptions   No medications on file  Previous Medications   FUROSEMIDE  (LASIX ) 40 MG TABLET    TAKE 1 TABLET  BY MOUTH DAILY   METOPROLOL SUCCINATE (TOPROL-XL) 25 MG 24 HR TABLET    TAKE ONE TABLET BY MOUTH EVERY DAY   POLYETHYLENE GLYCOL (MIRALAX / GLYCOLAX) 17 G PACKET    Take 17 g by mouth as needed.   PRAVASTATIN (PRAVACHOL) 20 MG TABLET    TAKE ONE TABLET BY MOUTH EVERY DAY   SPIRONOLACTONE  (ALDACTONE ) 100 MG TABLET    Take 1 tablet (100 mg total) by mouth daily.   TAMSULOSIN  (FLOMAX ) 0.4 MG CAPS CAPSULE    TAKE 2 CAPSULES AT BEDTIME   TIZANIDINE  (ZANAFLEX ) 2 MG CAPSULE    Take 1 capsule (2 mg total) by mouth  3 (three) times daily as needed for muscle spasms.   XARELTO 20 MG TABS TABLET    TAKE ONE TABLET BY MOUTH EVERY DAY WITH EVENING MEAL  Modified Medications   No medications on file  Discontinued Medications   No medications on file    Physical Exam:  Vitals:   01/02/23 1023  BP: 126/74  Pulse: (!) 56  Temp: 97.7 F (36.5 C)  SpO2: 100%  Weight: 158 lb (71.7 kg)  Height: 5' 7 (1.702 m)   Body mass index is 24.75 kg/m. Wt Readings from Last 3 Encounters:  01/02/23 158 lb (71.7 kg)  12/22/22 164 lb 12.8 oz (74.8 kg)  11/24/22 170 lb (77.1 kg)    Physical Exam Constitutional:      General: He is not in acute distress.    Appearance: He is well-developed. He is not diaphoretic.  HENT:     Head: Normocephalic and atraumatic.     Right Ear: External ear normal.     Left Ear: External ear normal.     Mouth/Throat:     Pharynx: No oropharyngeal exudate.  Eyes:     Conjunctiva/sclera: Conjunctivae normal.     Pupils: Pupils are equal, round, and reactive to light.  Cardiovascular:     Rate and Rhythm: Normal rate and regular rhythm.     Heart sounds: Normal heart sounds.  Pulmonary:     Effort: Pulmonary effort is normal.     Breath sounds: Normal breath sounds.  Abdominal:     General: Bowel sounds are normal.     Palpations: Abdomen is soft.  Musculoskeletal:        General: No tenderness.     Cervical back: Normal range of motion and neck supple.     Right lower leg: No edema.     Left lower leg: No edema.  Skin:    General: Skin is warm and dry.  Neurological:     Mental Status: He is alert and oriented to person, place, and time.     Motor: No weakness.     Gait: Gait normal.  Psychiatric:        Mood and Affect: Mood normal.     Labs reviewed: Basic Metabolic Panel: Recent Labs    09/18/22 0800 09/26/22 1430 12/21/22 0846  NA 141 142 139  K 4.3 4.4 4.3  CL 104 102 101  CO2 27 24 23   GLUCOSE 80 60* 90  BUN 27* 31* 27  CREATININE 0.75 1.04  1.08  CALCIUM 9.5 9.3 9.6   Liver Function Tests: Recent Labs    04/21/22 1046 07/31/22 0740 09/11/22 1347 09/18/22 0800 09/26/22 1430  AST 25   < > 21 24 26   ALT 23   < > 15 18 18   ALKPHOS 173*  --  185*  --  197*  BILITOT 2.4*   < > 3.9* 3.8* 4.2*  PROT 6.8   < > 6.2 6.6 6.7  ALBUMIN 4.4  --  4.1  --  4.2   < > = values in this interval not displayed.   No results for input(s): LIPASE, AMYLASE in the last 8760 hours. No results for input(s): AMMONIA in the last 8760 hours. CBC: Recent Labs    04/21/22 1046 09/26/22 1430  WBC 6.2 5.6  NEUTROABS 4.2 3.6  HGB 14.2 12.6*  HCT 40.4 36.6*  MCV 101* 101*  PLT 238 164   Lipid Panel: Recent Labs    07/31/22 0740  CHOL 133  HDL 61  LDLCALC 59  TRIG 51  CHOLHDL 2.2   TSH: No results for input(s): TSH in the last 8760 hours. A1C: No results found for: HGBA1C   Assessment/Plan 1. Chronic atrial fibrillation (HCC) (Primary) Rate controlled, continues on metoprolol and xarelto for anticoagulation   2. Other cirrhosis of liver (HCC) -stable, continues to follow up by GI, started on aldactone    3. Bilateral leg edema -improved at this time  4. Benign prostatic hyperplasia with urinary frequency Stable on flomax   5. Pleural effusion, right Improved on recent follow up.   6. Inguinal hernia, right -stable, not a surgery candidate but without complications at this time.   7. Mixed hyperlipidemia Stable, continues on pravastatin  8. Anemia, unspecified type -noted on recent labs, no signs of bleeding, will follow up cbc.  - CBC with Differential/Platelet  Next appt: 6 months at [facility] K. Caro BODILY  [facility] [phone]

## 2023-01-09 ENCOUNTER — Other Ambulatory Visit: Payer: Self-pay | Admitting: Nurse Practitioner

## 2023-01-09 DIAGNOSIS — R748 Abnormal levels of other serum enzymes: Secondary | ICD-10-CM | POA: Diagnosis not present

## 2023-01-09 DIAGNOSIS — D649 Anemia, unspecified: Secondary | ICD-10-CM | POA: Diagnosis not present

## 2023-01-09 DIAGNOSIS — K7469 Other cirrhosis of liver: Secondary | ICD-10-CM | POA: Diagnosis not present

## 2023-01-10 LAB — BASIC METABOLIC PANEL
BUN/Creatinine Ratio: 36 — ABNORMAL HIGH (ref 10–24)
BUN: 36 mg/dL — ABNORMAL HIGH (ref 8–27)
CO2: 22 mmol/L (ref 20–29)
Calcium: 9.4 mg/dL (ref 8.6–10.2)
Chloride: 104 mmol/L (ref 96–106)
Creatinine, Ser: 1.01 mg/dL (ref 0.76–1.27)
Glucose: 87 mg/dL (ref 70–99)
Potassium: 4.5 mmol/L (ref 3.5–5.2)
Sodium: 140 mmol/L (ref 134–144)
eGFR: 76 mL/min/{1.73_m2} (ref >59)

## 2023-01-10 LAB — CBC WITH DIFFERENTIAL/PLATELET
Basophils Absolute: 0.1 10*3/uL (ref 0.0–0.2)
Basos: 1 %
EOS (ABSOLUTE): 0.1 10*3/uL (ref 0.0–0.4)
Eos: 3 %
Hematocrit: 33.8 % — ABNORMAL LOW (ref 37.5–51.0)
Hemoglobin: 11.8 g/dL — ABNORMAL LOW (ref 13.0–17.7)
Immature Grans (Abs): 0 10*3/uL (ref 0.0–0.1)
Immature Granulocytes: 0 %
Lymphocytes Absolute: 1.1 10*3/uL (ref 0.7–3.1)
Lymphs: 21 %
MCH: 35 pg — ABNORMAL HIGH (ref 26.6–33.0)
MCHC: 34.9 g/dL (ref 31.5–35.7)
MCV: 100 fL — ABNORMAL HIGH (ref 79–97)
Monocytes Absolute: 0.5 10*3/uL (ref 0.1–0.9)
Monocytes: 9 %
Neutrophils Absolute: 3.4 10*3/uL (ref 1.4–7.0)
Neutrophils: 66 %
Platelets: 151 10*3/uL (ref 150–450)
RBC: 3.37 x10E6/uL — ABNORMAL LOW (ref 4.14–5.80)
RDW: 12 % (ref 11.6–15.4)
WBC: 5.1 10*3/uL (ref 3.4–10.8)

## 2023-01-21 ENCOUNTER — Other Ambulatory Visit: Payer: Self-pay | Admitting: Nurse Practitioner

## 2023-02-09 ENCOUNTER — Encounter: Payer: Self-pay | Admitting: Cardiology

## 2023-02-09 ENCOUNTER — Ambulatory Visit: Payer: Medicare PPO | Attending: Cardiology | Admitting: Cardiology

## 2023-02-09 VITALS — BP 122/86 | HR 55 | Ht 67.0 in | Wt 155.6 lb

## 2023-02-09 DIAGNOSIS — E782 Mixed hyperlipidemia: Secondary | ICD-10-CM | POA: Diagnosis not present

## 2023-02-09 DIAGNOSIS — I429 Cardiomyopathy, unspecified: Secondary | ICD-10-CM | POA: Diagnosis not present

## 2023-02-09 DIAGNOSIS — I482 Chronic atrial fibrillation, unspecified: Secondary | ICD-10-CM

## 2023-02-09 DIAGNOSIS — R600 Localized edema: Secondary | ICD-10-CM | POA: Diagnosis not present

## 2023-02-09 DIAGNOSIS — G4733 Obstructive sleep apnea (adult) (pediatric): Secondary | ICD-10-CM

## 2023-02-09 DIAGNOSIS — K7469 Other cirrhosis of liver: Secondary | ICD-10-CM | POA: Diagnosis not present

## 2023-02-09 DIAGNOSIS — I10 Essential (primary) hypertension: Secondary | ICD-10-CM | POA: Diagnosis not present

## 2023-02-09 NOTE — Patient Instructions (Signed)
 Medication Instructions:  No changes *If you need a refill on your cardiac medications before your next appointment, please call your pharmacy*   Lab Work: None ordered If you have labs (blood work) drawn today and your tests are completely normal, you will receive your results only by: MyChart Message (if you have MyChart) OR A paper copy in the mail If you have any lab test that is abnormal or we need to change your treatment, we will call you to review the results.   Testing/Procedures: None ordered   Follow-Up: At [facility], you and your health needs are our priority.  As part of our continuing mission to provide you with exceptional heart care, we have created designated Provider Care Teams.  These Care Teams include your primary Cardiologist (physician) and Advanced Practice Providers (APPs -  Physician Assistants and Nurse Practitioners) who all work together to provide you with the care you need, when you need it.  We recommend signing up for the patient portal called MyChart.  Sign up information is provided on this After Visit Summary.  MyChart is used to connect with patients for Virtual Visits (Telemedicine).  Patients are able to view lab/test results, encounter notes, upcoming appointments, etc.  Non-urgent messages can be sent to your provider as well.   To learn more about what you can do with MyChart, go to [email or web address].    Your next appointment:   6 month(s)  Provider:   You may see Timothy Gollan, MD or one of the following Advanced Practice Providers on your desig Tylene Lunch, NP

## 2023-02-09 NOTE — Progress Notes (Signed)
 Cardiology Office Note:  .   Date:  02/09/2023  ID:  REEF ACHTERBERG, DOB 1945-05-14, MRN [id number] PCP: Caro Harlene POUR, NP  [facility] Providers Cardiologist:  Evalene Lunger, MD    History of Present Illness: Randy Montgomery is a 78 y.o. male with past medical history of permanent atrial fibrillation with failed cardioversion (2012), hyperlipidemia, OSA on CPAP, BPH, ascites secondary to alcohol cirrhosis, hyperlipidemia, hypertension, who is here today for follow-up on his atrial fibrillation.  Had previously been diagnosed with atrial fibrillation dating back in 2011.  Had failed cardioversion in 2012.  Echocardiogram in 09/2021 revealed ejection fraction 40 to 45%, RV function mildly reduced, left and right atrium was severely dilated, moderate pleural effusion.  He had to be started on Lasix  20 mg daily for leg swelling once improved over a 62-month timeframe.  He continued be compliant with CPAP.  In 10/2021 he continued to have bilateral leg swelling was continued on 20 mg of Lasix .  He was noted to be bradycardic but was asymptomatic.  He was last seen in clinic 08/09/2022 where he was doing well from a cardiac perspective.  Had occasional shortness of breath and had unchanged leg swelling.  He had continued to take diuretic therapy furosemide  20 mg daily and was wearing his compression stockings.  There were no medication changes that were made at that time and no further testing was ordered.  He returns to clinic today stating that he has been doing well.  He denies any shortness of breath, chest pain, palpitations.  States that he has been compliant with his rivaroxaban.  Denies any bleeding with no blood noted in his urine or stool.  Also symptomatic spironolactone  has greatly reduced swelling in his abdomen and legs.  Denies any hospitalizations or visits to the emergency department.  ROS: 10 point review of systems has been reviewed and considered negative except what is  been listed in the HPI  Studies Reviewed: SABRA        TTE 09/02/21 1. Left ventricular ejection fraction, by estimation, is 40 to 45%. The  left ventricle has mildly decreased function. The left ventricle  demonstrates global hypokinesis. Left ventricular diastolic parameters are  indeterminate.   2. Right ventricular systolic function is moderately reduced. The right  ventricular size is severely enlarged. There is normal pulmonary artery  systolic pressure. The estimated right ventricular systolic pressure is  24.9 mmHg.   3. Left atrial size was severely dilated.   4. Right atrial size was severely dilated.   5. Moderate pleural effusion in the left lateral region.   6. The mitral valve is normal in structure. No evidence of mitral valve  regurgitation. No evidence of mitral stenosis.   7. Tricuspid valve regurgitation is moderate to severe.   8. The aortic valve is normal in structure. Aortic valve regurgitation is  not visualized. Aortic valve sclerosis/calcification is present, without  any evidence of aortic stenosis.   9. There is mild dilatation of the aortic root, measuring 41 mm.  10. The inferior vena cava is normal in size with greater than 50%  respiratory variability, suggesting right atrial pressure of 3 mmHg.  Risk Assessment/Calculations:    CHA2DS2-VASc Score = 3   This indicates a 3.2% annual risk of stroke. The patient's score is based upon: CHF History: 0 HTN History: 1 Diabetes History: 0 Stroke History: 0 Vascular Disease History: 0 Age Score: 2 Gender Score: 0  Physical Exam:   VS:  BP 122/86   Pulse (!) 55   Ht 5' 7 (1.702 m)   Wt 155 lb 9.6 oz (70.6 kg)   SpO2 97%   BMI 24.37 kg/m    Wt Readings from Last 3 Encounters:  02/09/23 155 lb 9.6 oz (70.6 kg)  01/02/23 158 lb (71.7 kg)  12/22/22 164 lb 12.8 oz (74.8 kg)    GEN: Well nourished, well developed in no acute distress NECK: No JVD; No carotid bruits CARDIAC: IR IR,  bradycardic, no murmurs, rubs, gallops RESPIRATORY:  Clear to auscultation without rales, wheezing or rhonchi  ABDOMEN: Soft, non-tender, non-distended EXTREMITIES: Trace pitting edema; No deformity   ASSESSMENT AND PLAN: .   Permanent atrial fibrillation, rate has continued on Toprol-XL 25 mg daily.  He is continued on rivaroxaban 20 mg daily for CHA2DS2-VASc of at least 3 for stroke prophylaxis.  He denies any recurrent nosebleeds and recently had his hemoglobin checked by his PCP that resulted at 11.8.  He denies any bleeding or noted blood in his stool or urine.  Has upcoming appointment with his PCP for recheck.  Primary hypertension with a blood pressure today 120/86.  Blood pressures remain stable.  He is continued on Toprol-XL 25 mg daily and Aldactone  100 mg daily.  He has been encouraged to continue to monitor pressures 1 to 2 hours postmedication administration as well.  Mixed hyperlipidemia with his last LDL 59.  He is continued on pravastatin 20 mg daily.  This continues to be managed by his PCP.  Bilateral lower extremity edema likely secondary to his cirrhosis.  It has improved with the spironolactone  100 mg daily.  Swelling has been greatly reduced.  He is encouraged to continue with conservative therapy as well as wearing his compression stockings, decrease the sodium and fluid intake and elevating his extremities.  He also continues to follow with his GI specialist with updated abdominal ultrasounds.  Cardiomyopathy with an LVEF of 40 to 45% on echocardiogram dated 9/23.  Patient's symptoms are unchanged.  If he has worsening shortness of breath or edema will consider reordering echocardiogram.  He is continued on furosemide  40 mg daily and Toprol-XL 25 mg daily.  Obstructive sleep apnea rate continues to be compliant with his CPAP.       Dispo: Patient return to clinic to see MD/APP in 6 months or sooner for reevaluation of symptoms  Signed, Jeanifer Halliday, NP

## 2023-02-13 ENCOUNTER — Ambulatory Visit: Payer: Medicare PPO | Admitting: Gastroenterology

## 2023-02-13 VITALS — BP 104/67 | HR 43 | Temp 97.7°F | Ht 67.0 in | Wt 160.0 lb

## 2023-02-13 DIAGNOSIS — K7460 Unspecified cirrhosis of liver: Secondary | ICD-10-CM

## 2023-02-13 DIAGNOSIS — F1091 Alcohol use, unspecified, in remission: Secondary | ICD-10-CM

## 2023-02-13 DIAGNOSIS — K7469 Other cirrhosis of liver: Secondary | ICD-10-CM

## 2023-02-13 DIAGNOSIS — R188 Other ascites: Secondary | ICD-10-CM

## 2023-02-13 DIAGNOSIS — R748 Abnormal levels of other serum enzymes: Secondary | ICD-10-CM | POA: Diagnosis not present

## 2023-02-13 NOTE — Progress Notes (Signed)
Wyline Mood MD, MRCP(U.K) [address]  [address]  [place], [place] [address]  Main: [phone]  Fax: [phone]   Primary Care Physician: Sharon Seller, NP  Primary Gastroenterologist:  Dr. Wyline Mood   Chief Complaint  Patient presents with   cirrhosis of the liver    HPI: Randy Montgomery is a 78 y.o. male Summary of history :   Initially seen in October 2023 and was found to have features of cirrhosis likely secondary to alcohol excess consumption over 20 to 30 years..  With perihepatic ascites on a recent ultrasound.He denies any illegal drug use. No family history of liver disease.  10/31/2021: AFP normal not immune to hepatitis A and B recommended vaccine otherwise viral hepatitis and autoimmune markers were negative 11/23/2022: EGD: Hiatal hernia seen no evidence of esophageal varices  MELD score of 11 in April 2024 05/05/2022: Right upper quadrant ultrasound no liver masses seen. 09/15/2022 CT scan of the abdomen pelvis without contrast shows large right inguinal hernia which extends to the scrotum contains a large portion of cecum hepatic cirrhosis moderate size pleural effusion with adjacent atelectasis the right lower lobe.     07/31/2022: GGT 214 09/18/2022 alkaline phosphatase 168 total bilirubin 3.8 AST 24 ALT 18 09/11/2022 PTH calcium normal vitamin D low   Interval history  09/26/2022-02/13/2023  11/06/2022: RUQ USG: Cirrhosis of liver and ascites   12/10/2022: MR abdomen MRCP : for elevated alk phos :  1. Cirrhosis. No suspicious liver lesion. 2. Moderate volume ascites. 3. Dilatation of the inferior vena cava, consistent with elevated right heart pressures. 4. Cardiomegaly. Moderate right pleural effusion. 5. Descending colonic diverticulosis without evidence of acute diverticulitis.   01/09/2023:Hemoglobin 11.8 MCV 100 platelets 151 creatinine of 1.01  Since last visit has lost 12 pounds of weight.  Seen by Dr. Larinda Buttery for pleural effusion felt  to have hepatic hydrothorax, trace pleural effusion seen while the patient had spironolactone commenced in addition to furosemide 40 mg.  He is doing well denies any NSAID use denies any change in his sleep habits.  Denies any constipation takes lactulose daily no alcohol use. Current Outpatient Medications  Medication Sig Dispense Refill   furosemide (LASIX) 40 MG tablet TAKE 1 TABLET BY MOUTH DAILY 30 tablet 2   metoprolol succinate (TOPROL-XL) 25 MG 24 hr tablet TAKE ONE TABLET BY MOUTH EVERY DAY 90 tablet 1   polyethylene glycol (MIRALAX / GLYCOLAX) 17 g packet Take 17 g by mouth as needed.     pravastatin (PRAVACHOL) 20 MG tablet TAKE ONE TABLET BY MOUTH EVERY DAY 90 tablet 1   spironolactone (ALDACTONE) 100 MG tablet Take 1 tablet (100 mg total) by mouth daily. 90 tablet 3   tamsulosin (FLOMAX) 0.4 MG CAPS capsule TAKE 2 CAPSULES AT BEDTIME 180 capsule 3   tizanidine (ZANAFLEX) 2 MG capsule Take 1 capsule (2 mg total) by mouth 3 (three) times daily as needed for muscle spasms. 20 capsule 1   XARELTO 20 MG TABS tablet TAKE ONE TABLET BY MOUTH EVERY DAY WITH EVENING MEAL 90 tablet 1   No current facility-administered medications for this visit.    Allergies as of 02/13/2023   (No Known Allergies)     ROS:  General: Negative for anorexia, weight loss, fever, chills, fatigue, weakness. ENT: Negative for hoarseness, difficulty swallowing , nasal congestion. CV: Negative for chest pain, angina, palpitations, dyspnea on exertion, peripheral edema.  Respiratory: Negative for dyspnea at rest, dyspnea on exertion, cough, sputum,  wheezing.  GI: See history of present illness. GU:  Negative for dysuria, hematuria, urinary incontinence, urinary frequency, nocturnal urination.  Endo: Negative for unusual weight change.    Physical Examination:   BP 104/67   Pulse (!) 43   Temp 97.7 F (36.5 C) (Oral)   Ht 5\' 7"  (1.702 m)   Wt 160 lb (72.6 kg)   BMI 25.06 kg/m   General:  Well-nourished, well-developed in no acute distress.  Eyes: No icterus. Conjunctivae pink. Mouth: Oropharyngeal mucosa moist and pink , no lesions erythema or exudate. Neuro: Alert and oriented x 3.  Grossly intact. Skin: Warm and dry, no jaundice.   Psych: Alert and cooperative, normal mood and affect.   Imaging Studies: No results found.  Assessment and Plan:   Randy Montgomery is a 78 y.o. y/o male here to follow-up for liver cirrhosis.  Based on history very likely secondary to chronic excess alcohol consumption over the years that he has stopped.  Autoimmune and viral hepatitis workup has been negative. His alkaline phosphatase is elevated previously.  Autoimmune markers previously were negative.  It could be just due to low vitamin D as well as excessive use of alcohol.  MELD score of 15 in  09/2022    Plan  1.  Hepatitis A and B vaccine, check vitamin D levels 2.  Screen for [facility] with AFP and ultrasound in May 2025 3.  Screen for esophageal varices in January 2027 4.  Advised to stop all alcohol consumption 5.  Labs to calculate MELD score, continue Lasix 40 mg and Aldactone 100 mg his weight has decreased since the last visit by 12 pounds I assume that this is due to fluid.  Following with Dr. Larinda Buttery for hepatic hydrothorax minimal effusion on last evaluation.   6.  Elevated alkaline phosphatase recheck today if it is still elevated in the future can consider liver biopsy if alkaline phosphatase does not remain stable.    Dr Wyline Mood  MD,MRCP [facility]) Follow up in 4 months

## 2023-02-13 NOTE — Addendum Note (Signed)
Addended by: Adela Ports on: 02/13/2023 01:55 PM   Modules accepted: Orders

## 2023-02-16 DIAGNOSIS — G4733 Obstructive sleep apnea (adult) (pediatric): Secondary | ICD-10-CM | POA: Diagnosis not present

## 2023-02-21 LAB — COMPREHENSIVE METABOLIC PANEL
ALT: 16 [IU]/L (ref 0–44)
AST: 22 [IU]/L (ref 0–40)
Albumin: 4.4 g/dL (ref 3.8–4.8)
Alkaline Phosphatase: 161 [IU]/L — ABNORMAL HIGH (ref 44–121)
BUN/Creatinine Ratio: 32 — ABNORMAL HIGH (ref 10–24)
BUN: 35 mg/dL — ABNORMAL HIGH (ref 8–27)
Bilirubin Total: 2.5 mg/dL — ABNORMAL HIGH (ref 0.0–1.2)
CO2: 25 mmol/L (ref 20–29)
Calcium: 9.7 mg/dL (ref 8.6–10.2)
Chloride: 102 mmol/L (ref 96–106)
Creatinine, Ser: 1.09 mg/dL (ref 0.76–1.27)
Globulin, Total: 2.7 g/dL (ref 1.5–4.5)
Glucose: 74 mg/dL (ref 70–99)
Potassium: 4.6 mmol/L (ref 3.5–5.2)
Sodium: 140 mmol/L (ref 134–144)
Total Protein: 7.1 g/dL (ref 6.0–8.5)
eGFR: 69 mL/min/{1.73_m2} (ref >59)

## 2023-02-21 LAB — CBC WITH DIFFERENTIAL/PLATELET
Basophils Absolute: 0 10*3/uL (ref 0.0–0.2)
Basos: 1 %
EOS (ABSOLUTE): 0.2 10*3/uL (ref 0.0–0.4)
Eos: 3 %
Hematocrit: 33.9 % — ABNORMAL LOW (ref 37.5–51.0)
Hemoglobin: 11.7 g/dL — ABNORMAL LOW (ref 13.0–17.7)
Immature Grans (Abs): 0 10*3/uL (ref 0.0–0.1)
Immature Granulocytes: 0 %
Lymphocytes Absolute: 1.1 10*3/uL (ref 0.7–3.1)
Lymphs: 20 %
MCH: 34.5 pg — ABNORMAL HIGH (ref 26.6–33.0)
MCHC: 34.5 g/dL (ref 31.5–35.7)
MCV: 100 fL — ABNORMAL HIGH (ref 79–97)
Monocytes Absolute: 0.6 10*3/uL (ref 0.1–0.9)
Monocytes: 11 %
Neutrophils Absolute: 3.6 10*3/uL (ref 1.4–7.0)
Neutrophils: 65 %
Platelets: 144 10*3/uL — ABNORMAL LOW (ref 150–450)
RBC: 3.39 x10E6/uL — ABNORMAL LOW (ref 4.14–5.80)
RDW: 12 % (ref 11.6–15.4)
WBC: 5.5 10*3/uL (ref 3.4–10.8)

## 2023-02-21 LAB — PROTIME-INR
INR: 1.2 (ref 0.9–1.2)
Prothrombin Time: 12.8 s — ABNORMAL HIGH (ref 9.1–12.0)

## 2023-02-21 LAB — VITAMIN D 1,25 DIHYDROXY
Vitamin D 1, 25 (OH)2 Total: 23 pg/mL
Vitamin D2 1, 25 (OH)2: <10 pg/mL
Vitamin D3 1, 25 (OH)2: 17 pg/mL

## 2023-02-22 ENCOUNTER — Encounter: Payer: Self-pay | Admitting: Gastroenterology

## 2023-03-05 ENCOUNTER — Other Ambulatory Visit: Payer: Self-pay | Admitting: Nurse Practitioner

## 2023-03-08 ENCOUNTER — Encounter: Payer: Self-pay | Admitting: Gastroenterology

## 2023-03-08 LAB — GAMMA GT: GGT: 194 IU/L — ABNORMAL HIGH (ref 0–65)

## 2023-03-08 LAB — SPECIMEN STATUS REPORT

## 2023-03-15 ENCOUNTER — Encounter: Payer: Self-pay | Admitting: Nurse Practitioner

## 2023-03-15 ENCOUNTER — Ambulatory Visit: Admitting: Nurse Practitioner

## 2023-03-15 VITALS — BP 118/64 | HR 64 | Temp 97.2°F | Ht 67.0 in | Wt 150.0 lb

## 2023-03-15 DIAGNOSIS — R634 Abnormal weight loss: Secondary | ICD-10-CM

## 2023-03-15 DIAGNOSIS — H938X3 Other specified disorders of ear, bilateral: Secondary | ICD-10-CM | POA: Diagnosis not present

## 2023-03-15 DIAGNOSIS — G4733 Obstructive sleep apnea (adult) (pediatric): Secondary | ICD-10-CM | POA: Diagnosis not present

## 2023-03-15 NOTE — Progress Notes (Signed)
 Careteam: Patient Care Team: Randy Seller, NP as PCP - General (Geriatric Medicine) Randy Iba, MD as PCP - Cardiology (Cardiology) Randy Mood, MD as Consulting Physician (Gastroenterology) PLACE OF SERVICE:  [facility]   Advanced Directive information    No Known Allergies  Chief Complaint  Patient presents with   Ear Fullness    Complains of Ear Fullness, has taken antihistamine with no relief. Patient thinks he has an infection.     Discussed the use of AI scribe software for clinical note transcription with the patient, who gave verbal consent to proceed.  HPI: Patient is a 78 y.o. male seen in today for bilaterally ear fullness   He experiences a sensation of fullness in both ears, describing it as feeling like 'having earphones on' where he can hear his own voice. This sensation occurs without any specific triggers such as blowing his nose. The fullness tends to clear up somewhat when lying down at night but returns during the day, often feeling 'stopped up' as if there is an imbalance of pressure similar to being on an airplane. No ear pain or nasal congestion. He has tried taking an antihistamine, 10 mg for eight days, without relief. He also uses saline nasal spray but not Flonase. No postnasal drip, headaches, or dizziness.   Noted to have weight loss since last visit.  He is currently taking spironolactone 100 mg and Lasix 40 mg, which he suspects may be contributing to his recent weight loss, although he has not changed his eating habits.     Review of Systems:  Review of Systems  Constitutional:  Negative for chills, fever and weight loss.  HENT:  Positive for hearing loss. Negative for congestion, sinus pain, sore throat and tinnitus.   Respiratory:  Negative for cough, sputum production and shortness of breath.   Cardiovascular:  Negative for chest pain, palpitations and leg swelling.  Skin: Negative.   Neurological:  Negative for dizziness  and headaches.    Past Medical History:  Diagnosis Date   Atrial fibrillation [facility])    Per [facility] new patient packet   BPH (benign prostatic hyperplasia)    Dysrhythmia    Elevated alkaline phosphatase level    Elevated PSA    Enlarged prostate    High cholesterol    Hx of colonoscopy 2016   Per [facility] new patient packet   Hypertension    Sleep apnea    Per [facility] new patient packet   Slow urinary stream    Past Surgical History:  Procedure Laterality Date   COLONOSCOPY  07/03/2016   COLONOSCOPY WITH PROPOFOL N/A 10/06/2020   Procedure: COLONOSCOPY WITH PROPOFOL;  Surgeon: Randy Mood, MD;  Location: [facility] ENDOSCOPY;  Service: Gastroenterology;  Laterality: N/A;   ESOPHAGOGASTRODUODENOSCOPY (EGD) WITH PROPOFOL N/A 11/22/2021   Procedure: ESOPHAGOGASTRODUODENOSCOPY (EGD) WITH PROPOFOL;  Surgeon: Randy Mood, MD;  Location: [facility] ENDOSCOPY;  Service: Gastroenterology;  Laterality: N/A;   HERNIA REPAIR Right 2010   inguinal hernia    PROSTATE BIOPSY     TONSILLECTOMY     Social History:   reports that he has never smoked. He has never used smokeless tobacco. He reports current alcohol use of about 6.0 standard drinks of alcohol per week. He reports that he does not use drugs.  Family History  Problem Relation Age of Onset   Aneurysm Father    Heart attack Father    Heart disease Father     Medications: Patient's Medications  New  Prescriptions   No medications on file  Previous Medications   FUROSEMIDE (LASIX) 40 MG TABLET    TAKE 1 TABLET BY MOUTH DAILY   METOPROLOL SUCCINATE (TOPROL-XL) 25 MG 24 HR TABLET    TAKE ONE TABLET BY MOUTH ONCE DAILY   POLYETHYLENE GLYCOL (MIRALAX / GLYCOLAX) 17 G PACKET    Take 17 g by mouth as needed.   PRAVASTATIN (PRAVACHOL) 20 MG TABLET    TAKE ONE TABLET BY MOUTH EVERY DAY   SPIRONOLACTONE (ALDACTONE) 100 MG TABLET    Take 1 tablet (100 mg total) by mouth daily.   TAMSULOSIN (FLOMAX) 0.4 MG CAPS CAPSULE    TAKE 2 CAPSULES AT BEDTIME   TIZANIDINE  (ZANAFLEX) 2 MG CAPSULE    Take 1 capsule (2 mg total) by mouth 3 (three) times daily as needed for muscle spasms.   XARELTO 20 MG TABS TABLET    TAKE ONE TABLET BY MOUTH EVERY DAY WITH EVENING MEAL  Modified Medications   No medications on file  Discontinued Medications   No medications on file    Physical Exam:  Vitals:   03/15/23 1013  BP: 118/64  Pulse: 64  Temp: (!) 97.2 F (36.2 C)  SpO2: 99%  Weight: 150 lb (68 kg)  Height: 5\' 7"  (1.702 m)   Body mass index is 23.49 kg/m. Wt Readings from Last 3 Encounters:  03/15/23 150 lb (68 kg)  02/13/23 160 lb (72.6 kg)  02/09/23 155 lb 9.6 oz (70.6 kg)    Physical Exam Constitutional:      General: He is not in acute distress.    Appearance: He is well-developed. He is not diaphoretic.  HENT:     Head: Normocephalic and atraumatic.     Right Ear: External ear normal. A middle ear effusion is present.     Left Ear: External ear normal. A middle ear effusion is present.     Mouth/Throat:     Pharynx: No oropharyngeal exudate.  Eyes:     Conjunctiva/sclera: Conjunctivae normal.     Pupils: Pupils are equal, round, and reactive to light.  Cardiovascular:     Rate and Rhythm: Normal rate and regular rhythm.     Heart sounds: Normal heart sounds.  Pulmonary:     Effort: Pulmonary effort is normal.     Breath sounds: Normal breath sounds.  Abdominal:     General: Bowel sounds are normal.     Palpations: Abdomen is soft.  Musculoskeletal:        General: No tenderness.     Cervical back: Normal range of motion and neck supple.     Right lower leg: No edema.     Left lower leg: No edema.  Skin:    General: Skin is warm and dry.  Neurological:     Mental Status: He is alert and oriented to person, place, and time.     Labs reviewed: Basic Metabolic Panel: Recent Labs    12/21/22 0846 01/09/23 1010 02/13/23 1429  NA 139 140 140  K 4.3 4.5 4.6  CL 101 104 102  CO2 23 22 25   GLUCOSE 90 87 74  BUN 27 36* 35*   CREATININE 1.08 1.01 1.09  CALCIUM 9.6 9.4 9.7   Liver Function Tests: Recent Labs    09/11/22 1347 09/18/22 0800 09/26/22 1430 02/13/23 1429  AST 21 24 26 22   ALT 15 18 18 16   ALKPHOS 185*  --  197* 161*  BILITOT 3.9* 3.8* 4.2* 2.5*  PROT 6.2 6.6 6.7 7.1  ALBUMIN 4.1  --  4.2 4.4   No results for input(s): "LIPASE", "AMYLASE" in the last 8760 hours. No results for input(s): "AMMONIA" in the last 8760 hours. CBC: Recent Labs    09/26/22 1430 01/09/23 1011 02/13/23 1429  WBC 5.6 5.1 5.5  NEUTROABS 3.6 3.4 3.6  HGB 12.6* 11.8* 11.7*  HCT 36.6* 33.8* 33.9*  MCV 101* 100* 100*  PLT 164 151 144*   Lipid Panel: Recent Labs    07/31/22 0740  CHOL 133  HDL 61  LDLCALC 59  TRIG 51  CHOLHDL 2.2   TSH: No results for input(s): "TSH" in the last 8760 hours. A1C: No results found for: "HGBA1C"   Assessment/Plan Assessment and Plan    Ear Fullness Bilateral ear fullness with fluid behind ears, likely Eustachian tube dysfunction or non-infectious fluid accumulation. Antihistamines ineffective. - Refer to ENT for evaluation and management. - Recommend Flonase nasal spray, two sprays in each nostril daily.  Weight Loss Noted weight loss likely due to fluid loss from spironolactone and Lasix. No dietary changes reported. - Monitor weight and dietary intake. - Evaluate for other causes if weight loss persists.  Continue to eat 3 meals daily     Chris Narasimhan K. Biagio Borg  [facility] [phone]

## 2023-03-17 ENCOUNTER — Other Ambulatory Visit: Payer: Self-pay | Admitting: Nurse Practitioner

## 2023-03-21 ENCOUNTER — Other Ambulatory Visit: Payer: Self-pay | Admitting: Nurse Practitioner

## 2023-03-21 DIAGNOSIS — R600 Localized edema: Secondary | ICD-10-CM

## 2023-03-22 ENCOUNTER — Other Ambulatory Visit: Payer: Self-pay | Admitting: Nurse Practitioner

## 2023-04-04 DIAGNOSIS — H6983 Other specified disorders of Eustachian tube, bilateral: Secondary | ICD-10-CM | POA: Diagnosis not present

## 2023-04-04 DIAGNOSIS — H903 Sensorineural hearing loss, bilateral: Secondary | ICD-10-CM | POA: Diagnosis not present

## 2023-04-11 DIAGNOSIS — G4733 Obstructive sleep apnea (adult) (pediatric): Secondary | ICD-10-CM | POA: Diagnosis not present

## 2023-04-18 DIAGNOSIS — Z0100 Encounter for examination of eyes and vision without abnormal findings: Secondary | ICD-10-CM | POA: Diagnosis not present

## 2023-04-18 DIAGNOSIS — H353131 Nonexudative age-related macular degeneration, bilateral, early dry stage: Secondary | ICD-10-CM | POA: Diagnosis not present

## 2023-04-18 DIAGNOSIS — H2513 Age-related nuclear cataract, bilateral: Secondary | ICD-10-CM | POA: Diagnosis not present

## 2023-05-07 ENCOUNTER — Ambulatory Visit
Admission: RE | Admit: 2023-05-07 | Discharge: 2023-05-07 | Disposition: A | Discharge status: Home / Self Care | Payer: Medicare PPO | Source: Ambulatory Visit | Attending: Gastroenterology | Admitting: Gastroenterology

## 2023-05-07 DIAGNOSIS — R188 Other ascites: Secondary | ICD-10-CM | POA: Diagnosis not present

## 2023-05-07 DIAGNOSIS — K7689 Other specified diseases of liver: Secondary | ICD-10-CM | POA: Diagnosis not present

## 2023-05-07 DIAGNOSIS — R748 Abnormal levels of other serum enzymes: Secondary | ICD-10-CM | POA: Diagnosis not present

## 2023-05-07 DIAGNOSIS — K7469 Other cirrhosis of liver: Secondary | ICD-10-CM | POA: Insufficient documentation

## 2023-05-07 DIAGNOSIS — K7460 Unspecified cirrhosis of liver: Secondary | ICD-10-CM | POA: Diagnosis not present

## 2023-05-10 ENCOUNTER — Other Ambulatory Visit: Payer: Self-pay

## 2023-05-15 ENCOUNTER — Encounter: Payer: Self-pay | Admitting: Gastroenterology

## 2023-05-15 ENCOUNTER — Ambulatory Visit: Admitting: Gastroenterology

## 2023-05-15 ENCOUNTER — Telehealth: Payer: Self-pay

## 2023-05-15 ENCOUNTER — Other Ambulatory Visit: Payer: Self-pay | Admitting: Gastroenterology

## 2023-05-15 VITALS — BP 117/71 | HR 45 | Temp 97.8°F | Wt 158.0 lb

## 2023-05-15 DIAGNOSIS — R748 Abnormal levels of other serum enzymes: Secondary | ICD-10-CM

## 2023-05-15 DIAGNOSIS — K7460 Unspecified cirrhosis of liver: Secondary | ICD-10-CM

## 2023-05-15 DIAGNOSIS — F1090 Alcohol use, unspecified, uncomplicated: Secondary | ICD-10-CM

## 2023-05-15 DIAGNOSIS — K7469 Other cirrhosis of liver: Secondary | ICD-10-CM

## 2023-05-15 NOTE — Telephone Encounter (Signed)
I didn't call patient.

## 2023-05-15 NOTE — Progress Notes (Signed)
 Luke Salaam MD, MRCP(U.K) [address]  [address]  [place], [place] [address]  Main: [phone]  Fax: [phone]   Primary Care Physician: Verma Gobble, NP  Primary Gastroenterologist:  Dr. Luke Salaam   Chief Complaint  Patient presents with   cirrhosis of the liver    HPI: Randy Montgomery is a 78 y.o. male Summary of history :   Initially seen in October 2023 and was found to have features of cirrhosis likely secondary to alcohol excess consumption over 20 to 30 years..  With perihepatic ascites on a recent ultrasound.He denies any illegal drug use. No family history of liver disease.  10/31/2021: AFP normal not immune to hepatitis A and B recommended vaccine otherwise viral hepatitis and autoimmune markers were negative 11/23/2022: EGD: Hiatal hernia seen no evidence of esophageal varices  MELD score of 11 in April 2024 05/05/2022: Right upper quadrant ultrasound no liver masses seen. 09/15/2022 CT scan of the abdomen pelvis without contrast shows large right inguinal hernia which extends to the scrotum contains a large portion of cecum hepatic cirrhosis moderate size pleural effusion with adjacent atelectasis the right lower lobe.     07/31/2022: GGT 214 09/18/2022 alkaline phosphatase 168 total bilirubin 3.8 AST 24 ALT 18 09/11/2022 PTH calcium normal vitamin D  low 11/06/2022: RUQ USG: Cirrhosis of liver and ascites    12/10/2022: MR abdomen MRCP : for elevated alk phos :  1. Cirrhosis. No suspicious liver lesion. 2. Moderate volume ascites. 3. Dilatation of the inferior vena cava, consistent with elevated right heart pressures. 4. Cardiomegaly. Moderate right pleural effusion. 5. Descending colonic diverticulosis without evidence of acute diverticulitis.     01/09/2023:Hemoglobin 11.8 MCV 100 platelets 151 creatinine of 1.01    Interval history  02/13/2023-05/15/2023 02/13/2023 GGT 194, vitamin D  normal, INR 1.2, alkaline phosphatase 161.   MELD score score  of 13 in February 2025 05/07/2023: Ultrasound right upper quadrant trace volume ascites cirrhosis no liver mass   Doing well since last visit weight has been stable.  Denies any use of NSAIDs sleeping well.  On Aldactone  100 mg and Lasix  40 mg.  Occasional drink of alcohol on his birthday but not taking regularly having regular bowel movements.   Current Outpatient Medications  Medication Sig Dispense Refill   fluticasone (FLONASE) 50 MCG/ACT nasal spray Place into both nostrils.     furosemide  (LASIX ) 40 MG tablet TAKE 1 TABLET BY MOUTH DAILY 30 tablet 2   metoprolol succinate (TOPROL-XL) 25 MG 24 hr tablet TAKE ONE TABLET BY MOUTH ONCE DAILY 90 tablet 1   polyethylene glycol (MIRALAX / GLYCOLAX) 17 g packet Take 17 g by mouth as needed.     pravastatin (PRAVACHOL) 20 MG tablet TAKE ONE TABLET BY MOUTH ONCE DAILY 90 tablet 1   spironolactone  (ALDACTONE ) 100 MG tablet Take 1 tablet (100 mg total) by mouth daily. 90 tablet 3   tamsulosin  (FLOMAX ) 0.4 MG CAPS capsule TAKE 2 CAPSULES AT BEDTIME 180 capsule 3   tizanidine  (ZANAFLEX ) 2 MG capsule Take 1 capsule (2 mg total) by mouth 3 (three) times daily as needed for muscle spasms. 20 capsule 1   XARELTO 20 MG TABS tablet TAKE ONE TABLET BY MOUTH EVERY DAY WITH EVENING MEAL 90 tablet 1   No current facility-administered medications for this visit.    Allergies as of 05/15/2023   (No Known Allergies)     ROS:  General: Negative for anorexia, weight loss, fever, chills, fatigue, weakness. ENT: Negative  for hoarseness, difficulty swallowing , nasal congestion. CV: Negative for chest pain, angina, palpitations, dyspnea on exertion, peripheral edema.  Respiratory: Negative for dyspnea at rest, dyspnea on exertion, cough, sputum, wheezing.  GI: See history of present illness. GU:  Negative for dysuria, hematuria, urinary incontinence, urinary frequency, nocturnal urination.  Endo: Negative for unusual weight change.    Physical  Examination:   BP 117/71   Pulse (!) 45   Temp 97.8 F (36.6 C) (Oral)   Wt 158 lb (71.7 kg)   BMI 24.75 kg/m   General: Well-nourished, well-developed in no acute distress.  Eyes: No icterus. Conjunctivae pink. Abdomen: Bowel sounds are normal, nontender, nondistended, no hepatosplenomegaly or masses, no abdominal bruits or hernia , no rebound or guarding.   Extremities: No lower extremity edema. No clubbing or deformities. Neuro: Alert and oriented x 3.  Grossly intact. Skin: Warm and dry, no jaundice.   Psych: Alert and cooperative, normal mood and affect.   Imaging Studies: US  ABDOMEN LIMITED RUQ (LIVER/GB) Result Date: 05/07/2023 CLINICAL DATA:  cirrhosis of the liver/ascites EXAM: ULTRASOUND ABDOMEN LIMITED RIGHT UPPER QUADRANT COMPARISON:  December 07, 2022 FINDINGS: Gallbladder: No gallstones visualized. Gallbladder wall thickness is normal when accounting for adjacent pericholecystic fat summation. No sonographic Murphy sign noted by sonographer. Common bile duct: Diameter: Visualized portion measures 2 mm, within normal limits. Majority is not visualized. Liver: No suspicious focal lesion identified. Benign RIGHT lower cyst is noted measuring to 11 mm. Heterogeneous and coarsened in parenchymal echogenicity. Nodular liver contour. Portal vein is patent on color Doppler imaging with normal direction of blood flow towards the liver. Other: Trace volume ascites. IVC is prominent measuring 3.7 cm. Cardiomegaly. IMPRESSION: 1. Cirrhotic liver morphology. No suspicious focal lesion identified. 2. Trace volume ascites. 3. Cardiomegaly with prominence of the IVC which may reflect RIGHT heart dysfunction. Electronically Signed   By: Clancy Crimes M.D.   On: 05/07/2023 13:41    Assessment and Plan:   Randy Montgomery is a 78 y.o. y/o male here to follow-up for liver cirrhosis.  Based on history very likely secondary to chronic excess alcohol consumption over the years that he has stopped.   Autoimmune and viral hepatitis workup has been negative. His alkaline phosphatase is elevated previously.  Autoimmune markers previously were negative.MELD score of 13 in February 2025.    Mildly elevated alkaline phosphatase with GGT, continue to monitor, if it rises can consider liver biopsy in  the future     Plan  1.  Status post vaccination for hepatitis A and B check serology to confirm immunity 2.  Screen for [facility] with AFP and ultrasound in November 2025 3.  Screen for esophageal varices in January 2027 4.  Labs to calculate MELD score, continue Lasix  40 mg and Aldactone  100 mg his weight has decreased since the last visit by 12 pounds I assume that this is due to fluid.  Following with Dr. Lucina Sabal for hepatic hydrothorax minimal effusion on last evaluation.       Dr Luke Salaam  MD,MRCP [facility]) Follow up in 5 months in [place]

## 2023-05-15 NOTE — Telephone Encounter (Signed)
 Copied from CRM [id number]. Topic: General - Call Back - No Documentation >> May 14, 2023  5:31 PM Corin V wrote: Reason for CRM: Patient said he received a call from the office from a Jamie or a Hannahgrace Lalli possibly. No note in chart indicating reason for call. Please call back if needing to speak to patient.

## 2023-05-15 NOTE — Patient Instructions (Signed)
 Please call [facility] at [phone] and schedule a follow up appointment with Dr. Antony Baumgartner in 5 months.

## 2023-05-16 ENCOUNTER — Ambulatory Visit: Payer: Self-pay

## 2023-05-16 DIAGNOSIS — H903 Sensorineural hearing loss, bilateral: Secondary | ICD-10-CM | POA: Diagnosis not present

## 2023-05-16 NOTE — Telephone Encounter (Signed)
 Called patient to let him know that his labs were stable. He understood and had no further questions.

## 2023-05-16 NOTE — Telephone Encounter (Signed)
-----   Message from Luke Salaam sent at 05/14/2023 10:14 AM EDT ----- stable

## 2023-05-17 DIAGNOSIS — G4733 Obstructive sleep apnea (adult) (pediatric): Secondary | ICD-10-CM | POA: Diagnosis not present

## 2023-05-18 LAB — CBC WITH DIFFERENTIAL/PLATELET
Basophils Absolute: 0.1 10*3/uL (ref 0.0–0.2)
Basos: 1 %
EOS (ABSOLUTE): 0.2 10*3/uL (ref 0.0–0.4)
Eos: 3 %
Hematocrit: 32.9 % — ABNORMAL LOW (ref 37.5–51.0)
Hemoglobin: 11.5 g/dL — ABNORMAL LOW (ref 13.0–17.7)
Immature Grans (Abs): 0 10*3/uL (ref 0.0–0.1)
Immature Granulocytes: 0 %
Lymphocytes Absolute: 1.4 10*3/uL (ref 0.7–3.1)
Lymphs: 21 %
MCH: 35.6 pg — ABNORMAL HIGH (ref 26.6–33.0)
MCHC: 35 g/dL (ref 31.5–35.7)
MCV: 102 fL — ABNORMAL HIGH (ref 79–97)
Monocytes Absolute: 0.6 10*3/uL (ref 0.1–0.9)
Monocytes: 10 %
Neutrophils Absolute: 4.5 10*3/uL (ref 1.4–7.0)
Neutrophils: 65 %
Platelets: 181 10*3/uL (ref 150–450)
RBC: 3.23 x10E6/uL — ABNORMAL LOW (ref 4.14–5.80)
RDW: 12.3 % (ref 11.6–15.4)
WBC: 6.8 10*3/uL (ref 3.4–10.8)

## 2023-05-18 LAB — HEPATITIS B SURFACE ANTIBODY,QUALITATIVE

## 2023-05-18 LAB — COMPREHENSIVE METABOLIC PANEL WITH GFR
ALT: 19 IU/L (ref 0–44)
AST: 21 IU/L (ref 0–40)
Albumin: 4.2 g/dL (ref 3.8–4.8)
Alkaline Phosphatase: 132 IU/L — ABNORMAL HIGH (ref 44–121)
BUN/Creatinine Ratio: 24 (ref 10–24)
BUN: 29 mg/dL — ABNORMAL HIGH (ref 8–27)
Bilirubin Total: 2 mg/dL — ABNORMAL HIGH (ref 0.0–1.2)
CO2: 25 mmol/L (ref 20–29)
Calcium: 9.7 mg/dL (ref 8.6–10.2)
Chloride: 99 mmol/L (ref 96–106)
Creatinine, Ser: 1.2 mg/dL (ref 0.76–1.27)
Globulin, Total: 2.4 g/dL (ref 1.5–4.5)
Glucose: 84 mg/dL (ref 70–99)
Potassium: 4.7 mmol/L (ref 3.5–5.2)
Sodium: 138 mmol/L (ref 134–144)
Total Protein: 6.6 g/dL (ref 6.0–8.5)
eGFR: 61 mL/min/{1.73_m2} (ref >59)

## 2023-05-18 LAB — PROTIME-INR
INR: 1.1 (ref 0.9–1.2)
Prothrombin Time: 12.4 s — ABNORMAL HIGH (ref 9.1–12.0)

## 2023-05-18 LAB — HEPATITIS A ANTIBODY, TOTAL

## 2023-06-13 DIAGNOSIS — H04221 Epiphora due to insufficient drainage, right lacrimal gland: Secondary | ICD-10-CM | POA: Diagnosis not present

## 2023-06-20 ENCOUNTER — Other Ambulatory Visit: Payer: Self-pay | Admitting: Nurse Practitioner

## 2023-06-20 DIAGNOSIS — R600 Localized edema: Secondary | ICD-10-CM

## 2023-06-28 DIAGNOSIS — G4733 Obstructive sleep apnea (adult) (pediatric): Secondary | ICD-10-CM | POA: Diagnosis not present

## 2023-07-03 ENCOUNTER — Encounter: Payer: Self-pay | Admitting: Nurse Practitioner

## 2023-07-03 ENCOUNTER — Ambulatory Visit: Payer: Medicare PPO | Admitting: Nurse Practitioner

## 2023-07-03 VITALS — BP 118/72 | HR 53 | Temp 98.0°F | Ht 67.0 in | Wt 158.0 lb

## 2023-07-03 DIAGNOSIS — G4733 Obstructive sleep apnea (adult) (pediatric): Secondary | ICD-10-CM

## 2023-07-03 DIAGNOSIS — I482 Chronic atrial fibrillation, unspecified: Secondary | ICD-10-CM | POA: Diagnosis not present

## 2023-07-03 DIAGNOSIS — E782 Mixed hyperlipidemia: Secondary | ICD-10-CM | POA: Diagnosis not present

## 2023-07-03 DIAGNOSIS — R600 Localized edema: Secondary | ICD-10-CM | POA: Insufficient documentation

## 2023-07-03 DIAGNOSIS — N401 Enlarged prostate with lower urinary tract symptoms: Secondary | ICD-10-CM | POA: Diagnosis not present

## 2023-07-03 DIAGNOSIS — D649 Anemia, unspecified: Secondary | ICD-10-CM | POA: Insufficient documentation

## 2023-07-03 DIAGNOSIS — R350 Frequency of micturition: Secondary | ICD-10-CM | POA: Diagnosis not present

## 2023-07-03 DIAGNOSIS — K7469 Other cirrhosis of liver: Secondary | ICD-10-CM | POA: Diagnosis not present

## 2023-07-03 NOTE — Assessment & Plan Note (Signed)
 Rate controlled on metoprolol  Continues on xarelto

## 2023-07-03 NOTE — Assessment & Plan Note (Signed)
 Ongoing and stable.

## 2023-07-03 NOTE — Assessment & Plan Note (Signed)
 Ongoing, stopped drinking 1 year ago.  Continues on aldactone  and lasix 

## 2023-07-03 NOTE — Assessment & Plan Note (Signed)
 Stable on lasix , aldactone  and compression hose.

## 2023-07-03 NOTE — Assessment & Plan Note (Signed)
 Overall stable, continues on flomax  0.8 mg at bedtime

## 2023-07-03 NOTE — Assessment & Plan Note (Signed)
 Continues on pravastatin, follow up lipids at next visit

## 2023-07-03 NOTE — Progress Notes (Signed)
 Careteam: Patient Care Team: Caro Harlene POUR, NP as PCP - General (Geriatric Medicine) Perla Evalene PARAS, MD as PCP - Cardiology (Cardiology) Therisa Bi, MD as Consulting Physician (Gastroenterology)  PLACE OF SERVICE:  [facility]  Advanced Directive information Does Patient Have a Medical Advance Directive?: Yes, Type of Advance Directive: Healthcare Power of [place];Living will;Out of facility DNR (pink MOST or yellow form), Does patient want to make changes to medical advance directive?: No - Patient declined  No Known Allergies  Chief Complaint  Patient presents with   Medical Management of Chronic Issues    Medical Management of Chronic Issues. 6 Month follow up    HPI:  Discussed the use of AI scribe software for clinical note transcription with the patient, who gave verbal consent to proceed.  History of Present Illness Randy Montgomery is an 78 year old male with cirrhosis and atrial fibrillation who presents for a routine follow-up visit.  He has a history of cirrhosis and is currently on spironolactone  100 mg daily and Lasix  40 mg daily for fluid management.  He experiences breast tenderness and an increase in breast tissue, coinciding with the start of spironolactone  treatment. He stopped drinking alcohol about a year ago,   His atrial fibrillation is managed with Xarelto 20 mg daily and Toprol XL 25 mg daily. No issues with palpitations or irregular heartbeat. Blood pressure is well-controlled, and he wears compression socks for stable leg swelling.  He has a history of prostate issues and previously had hematuria, which resolved without intervention. He is on Flomax  for prostate management and has no issues with urinary frequency or flow. He ensures complete voiding to prevent urinary retention. He went to urology but no longer requires follow up  He mentions a previous issue with ear fullness for which he was referred to ENT in March. The ENT suggested that his  hearing aids might be causing the sensation, but he experiences it even without the aids.  He experiences occasional leg cramps, which he manages by staying hydrated.   Review of Systems:  Review of Systems  Constitutional:  Negative for chills, fever and weight loss.  HENT:  Negative for tinnitus.   Respiratory:  Negative for cough, sputum production and shortness of breath.   Cardiovascular:  Negative for chest pain, palpitations and leg swelling.  Gastrointestinal:  Negative for abdominal pain, constipation, diarrhea and heartburn.  Genitourinary:  Negative for dysuria, frequency and urgency.  Musculoskeletal:  Negative for back pain, falls, joint pain and myalgias.  Skin: Negative.   Neurological:  Negative for dizziness and headaches.  Psychiatric/Behavioral:  Negative for depression and memory loss. The patient does not have insomnia.     Past Medical History:  Diagnosis Date   Atrial fibrillation [facility])    Per [facility] new patient packet   BPH (benign prostatic hyperplasia)    Dysrhythmia    Elevated alkaline phosphatase level    Elevated PSA    Enlarged prostate    High cholesterol    Hx of colonoscopy 2016   Per [facility] new patient packet   Hypertension    Sleep apnea    Per [facility] new patient packet   Slow urinary stream    Past Surgical History:  Procedure Laterality Date   COLONOSCOPY  07/03/2016   COLONOSCOPY WITH PROPOFOL  N/A 10/06/2020   Procedure: COLONOSCOPY WITH PROPOFOL ;  Surgeon: Therisa Bi, MD;  Location: [facility] ENDOSCOPY;  Service: Gastroenterology;  Laterality: N/A;   ESOPHAGOGASTRODUODENOSCOPY (EGD) WITH PROPOFOL  N/A  11/22/2021   Procedure: ESOPHAGOGASTRODUODENOSCOPY (EGD) WITH PROPOFOL ;  Surgeon: Therisa Bi, MD;  Location: [facility] ENDOSCOPY;  Service: Gastroenterology;  Laterality: N/A;   HERNIA REPAIR Right 2010   inguinal hernia    PROSTATE BIOPSY     TONSILLECTOMY     Social History:   reports that he has never smoked. He has never used smokeless tobacco.  He reports current alcohol use of about 6.0 standard drinks of alcohol per week. He reports that he does not use drugs.  Family History  Problem Relation Age of Onset   Aneurysm Father    Heart attack Father    Heart disease Father     Medications: Patient's Medications  New Prescriptions   No medications on file  Previous Medications   FLUTICASONE (FLONASE) 50 MCG/ACT NASAL SPRAY    Place into both nostrils.   FUROSEMIDE  (LASIX ) 40 MG TABLET    TAKE 1 TABLET BY MOUTH ONCE DAILY   METOPROLOL SUCCINATE (TOPROL-XL) 25 MG 24 HR TABLET    TAKE ONE TABLET BY MOUTH ONCE DAILY   MULTIPLE VITAMINS-MINERALS (PRESERVISION AREDS 2) CAPS    Take 1 capsule by mouth 2 (two) times daily.   POLYETHYLENE GLYCOL (MIRALAX / GLYCOLAX) 17 G PACKET    Take 17 g by mouth as needed.   PRAVASTATIN (PRAVACHOL) 20 MG TABLET    TAKE ONE TABLET BY MOUTH ONCE DAILY   SPIRONOLACTONE  (ALDACTONE ) 100 MG TABLET    Take 1 tablet (100 mg total) by mouth daily.   TAMSULOSIN  (FLOMAX ) 0.4 MG CAPS CAPSULE    TAKE 2 CAPSULES AT BEDTIME   TIZANIDINE  (ZANAFLEX ) 2 MG CAPSULE    Take 1 capsule (2 mg total) by mouth 3 (three) times daily as needed for muscle spasms.   XARELTO 20 MG TABS TABLET    TAKE ONE TABLET BY MOUTH ONCE DAILY WITHEVENING MEAL  Modified Medications   No medications on file  Discontinued Medications   No medications on file    Physical Exam:  Vitals:   07/03/23 0936  BP: 118/72  Pulse: (!) 53  Temp: 98 F (36.7 C)  SpO2: 99%  Weight: 158 lb (71.7 kg)  Height: 5' 7 (1.702 m)   Body mass index is 24.75 kg/m. Wt Readings from Last 3 Encounters:  07/03/23 158 lb (71.7 kg)  05/15/23 158 lb (71.7 kg)  03/15/23 150 lb (68 kg)    Physical Exam Constitutional:      General: He is not in acute distress.    Appearance: He is well-developed. He is not diaphoretic.  HENT:     Head: Normocephalic and atraumatic.     Right Ear: External ear normal.     Left Ear: External ear normal.      Mouth/Throat:     Pharynx: No oropharyngeal exudate.   Eyes:     Conjunctiva/sclera: Conjunctivae normal.     Pupils: Pupils are equal, round, and reactive to light.    Cardiovascular:     Rate and Rhythm: Normal rate and regular rhythm.     Heart sounds: Normal heart sounds.  Pulmonary:     Effort: Pulmonary effort is normal.     Breath sounds: Normal breath sounds.  Abdominal:     General: Bowel sounds are normal.     Palpations: Abdomen is soft.   Musculoskeletal:        General: No tenderness.     Cervical back: Normal range of motion and neck supple.     Right lower leg:  No edema.     Left lower leg: No edema.   Skin:    General: Skin is warm and dry.   Neurological:     Mental Status: He is alert and oriented to person, place, and time.     Labs reviewed: Basic Metabolic Panel: Recent Labs    01/09/23 1010 02/13/23 1429 05/15/23 1409  NA 140 140 138  K 4.5 4.6 4.7  CL 104 102 99  CO2 22 25 25   GLUCOSE 87 74 84  BUN 36* 35* 29*  CREATININE 1.01 1.09 1.20  CALCIUM 9.4 9.7 9.7   Liver Function Tests: Recent Labs    09/26/22 1430 02/13/23 1429 05/15/23 1409  AST 26 22 21   ALT 18 16 19   ALKPHOS 197* 161* 132*  BILITOT 4.2* 2.5* 2.0*  PROT 6.7 7.1 6.6  ALBUMIN 4.2 4.4 4.2   No results for input(s): LIPASE, AMYLASE in the last 8760 hours. No results for input(s): AMMONIA in the last 8760 hours. CBC: Recent Labs    01/09/23 1011 02/13/23 1429 05/15/23 1409  WBC 5.1 5.5 6.8  NEUTROABS 3.4 3.6 4.5  HGB 11.8* 11.7* 11.5*  HCT 33.8* 33.9* 32.9*  MCV 100* 100* 102*  PLT 151 144* 181   Lipid Panel: Recent Labs    07/31/22 0740  CHOL 133  HDL 61  LDLCALC 59  TRIG 51  CHOLHDL 2.2   TSH: No results for input(s): TSH in the last 8760 hours. A1C: No results found for: HGBA1C   Assessment/Plan Chronic atrial fibrillation (HCC) Assessment & Plan: Rate controlled on metoprolol  Continues on xarelto    Bilateral leg  edema Assessment & Plan: Stable on lasix , aldactone  and compression hose.    Benign prostatic hyperplasia with urinary frequency Assessment & Plan: Overall stable, continues on flomax  0.8 mg at bedtime    Mixed hyperlipidemia Assessment & Plan: Continues on pravastatin, follow up lipids at next visit   Anemia, unspecified type Assessment & Plan: Ongoing and stable.    OSA on CPAP Assessment & Plan: Continues on CPAP   Other cirrhosis of liver (HCC) Assessment & Plan: Ongoing, stopped drinking 1 year ago.  Continues on aldactone  and lasix       Follow up in 6 months at [facility]  Cadiz K. Caro BODILY [facility] [phone]

## 2023-07-03 NOTE — Assessment & Plan Note (Signed)
 Continues on CPAP.

## 2023-09-01 IMAGING — US US ABDOMEN LIMITED
1 series · 14 of 25 positions shown · non-contrast
Comparison: None.

CLINICAL DATA: Elevated bilirubin.

EXAM:
ULTRASOUND ABDOMEN LIMITED RIGHT UPPER QUADRANT

[Series 1: us abdomen limited · 0.19mm/px · 14 of 29 slices shown]
[im 1/29]
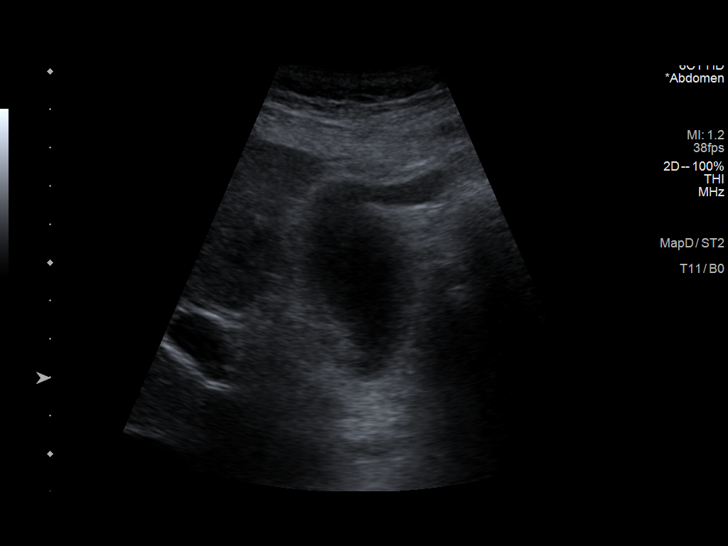
[im 3/29]
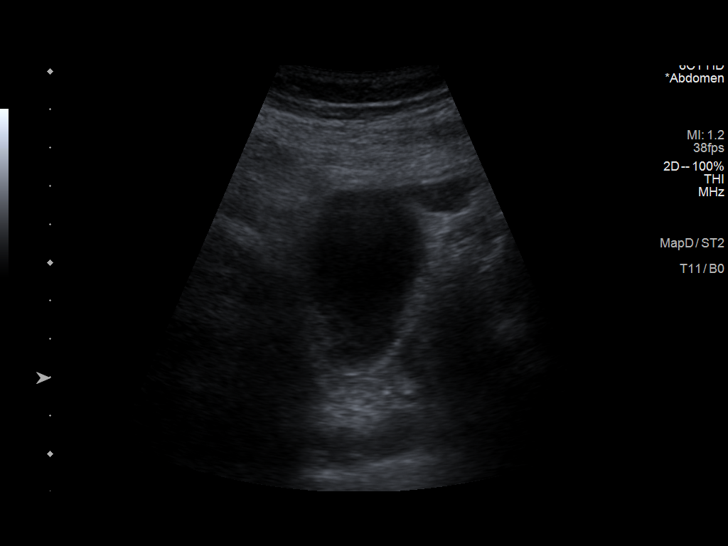
[im 5/29]
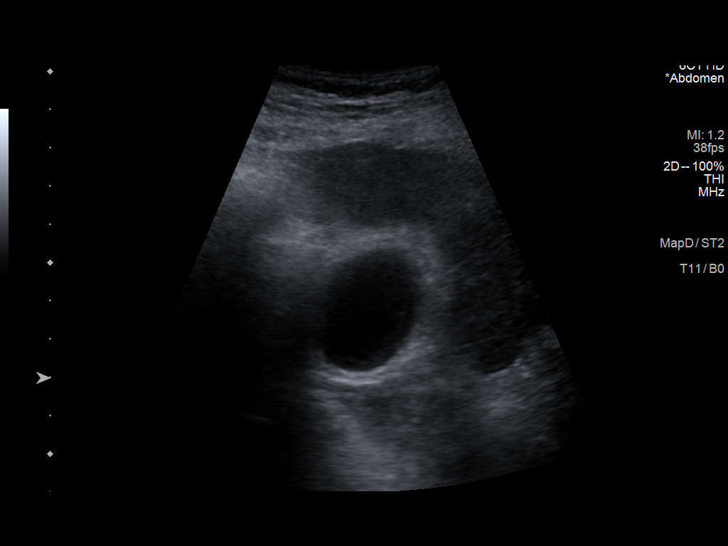
[im 8/29]
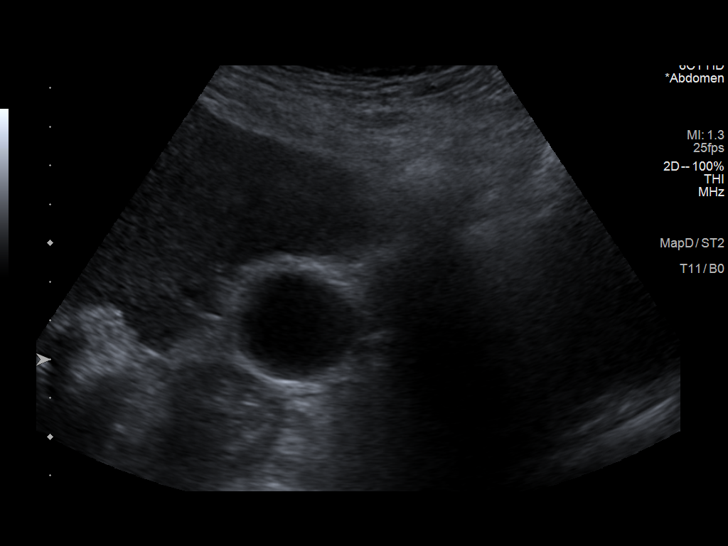
[im 10/29]
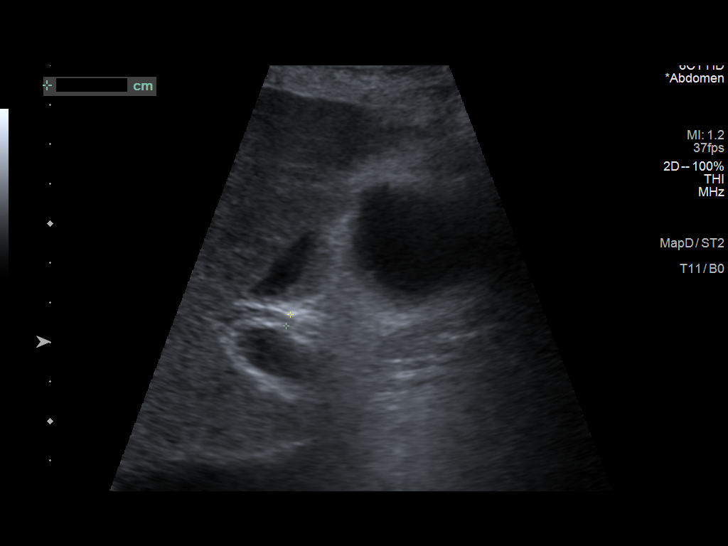
[im 11/29]
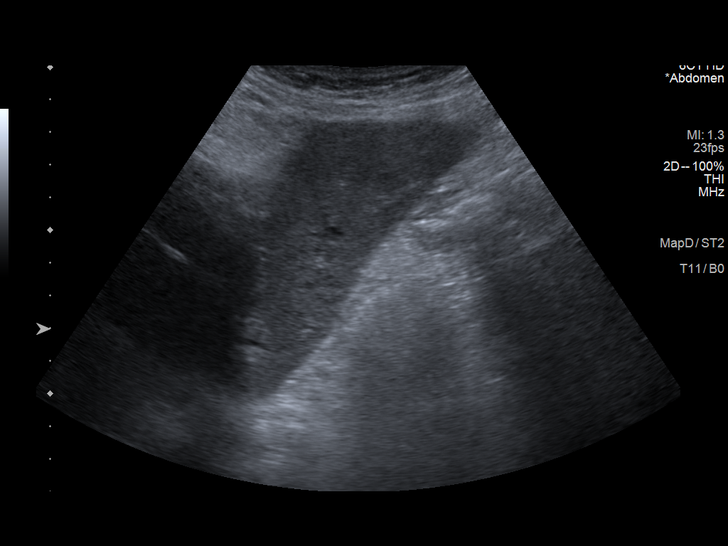
[im 13/29]
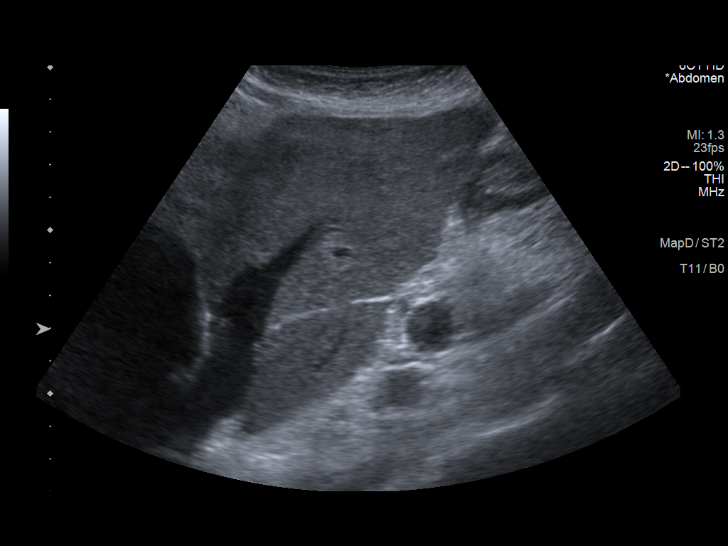
[im 16/29]
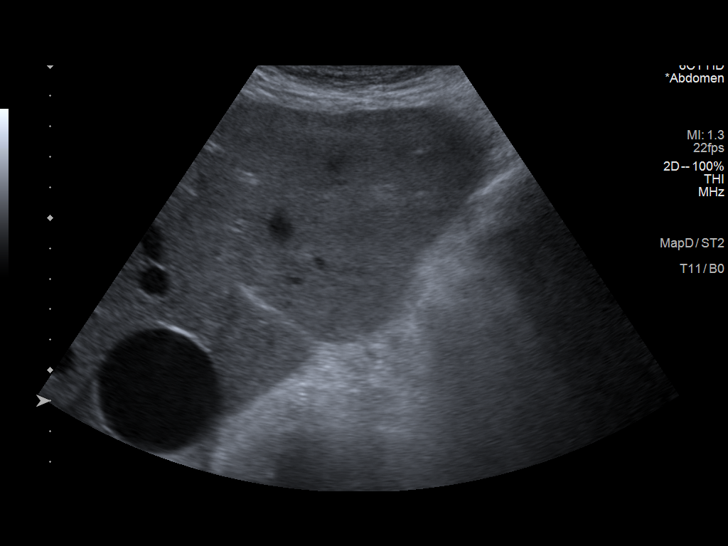
[im 18/29]
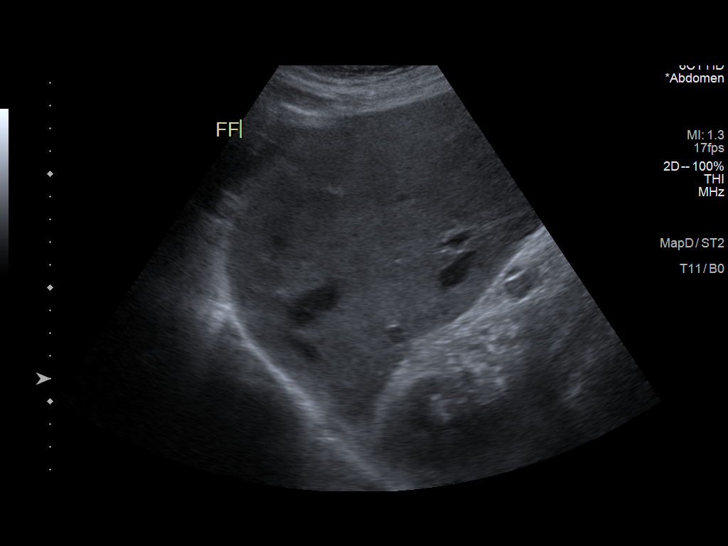
[im 19/29]
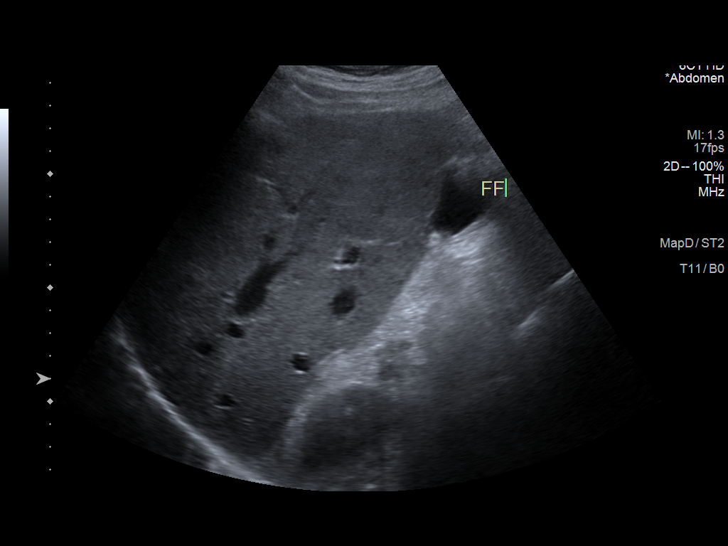
[im 22/29]
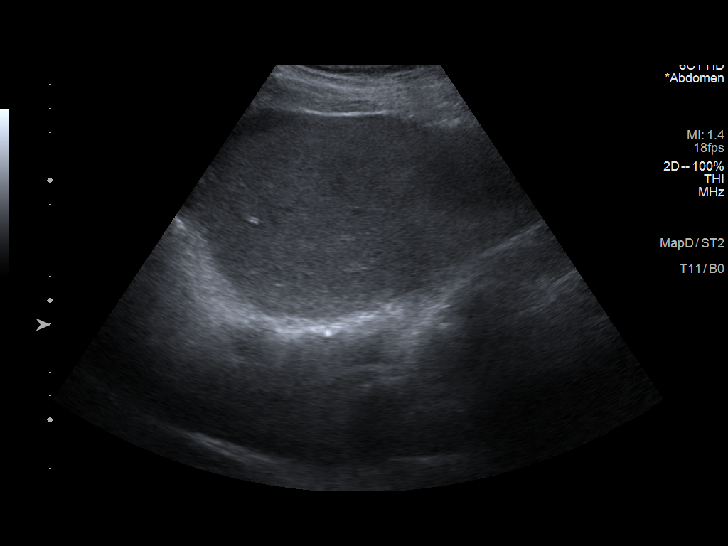
[im 24/29]
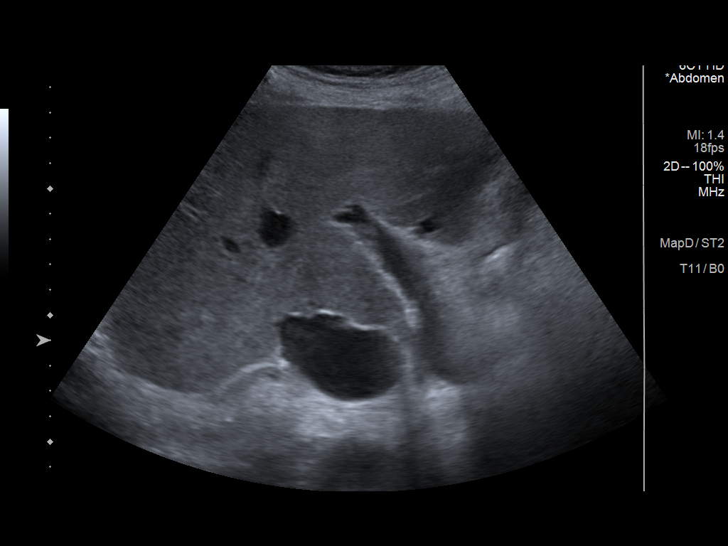
[im 26/29]
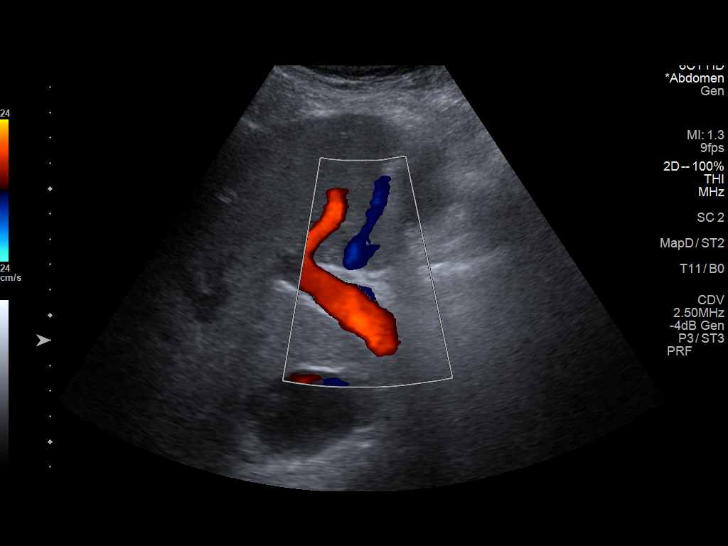
[im 29/29]
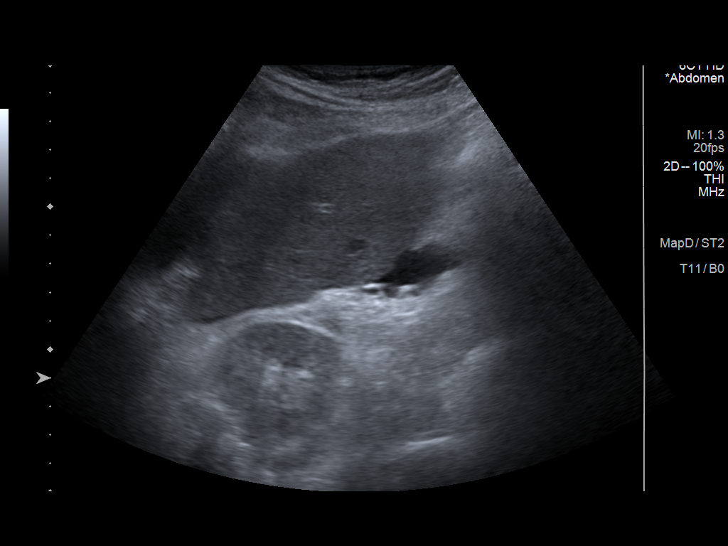

[14 of 25 positions shown; findings below may reference images not displayed]

FINDINGS: Gallbladder:

No gallstones or wall thickening visualized. No sonographic Murphy
sign noted by sonographer.

Common bile duct:

Diameter: 3 mm

Liver:

There is diffuse increased liver echogenicity most commonly seen in
the setting of fatty infiltration. Slight irregular appearance of
the liver contour may represent changes of cirrhosis. Clinical
correlation is recommended. Portal vein is patent on color Doppler
imaging with normal direction of blood flow towards the liver.

Other: Trace perihepatic ascites.
IMPRESSION: 1. No gallstone.
2. Fatty liver with possible early changes of cirrhosis.
3. Small perihepatic ascites.

## 2023-09-02 ENCOUNTER — Other Ambulatory Visit: Payer: Self-pay | Admitting: Nurse Practitioner

## 2023-09-16 ENCOUNTER — Other Ambulatory Visit: Payer: Self-pay | Admitting: Nurse Practitioner

## 2023-09-26 ENCOUNTER — Encounter: Payer: Self-pay | Admitting: Nurse Practitioner

## 2023-09-26 DIAGNOSIS — K7469 Other cirrhosis of liver: Secondary | ICD-10-CM

## 2023-09-27 NOTE — Telephone Encounter (Signed)
 He is trying to follow up with GI in [place], do you know anything going on with this office?

## 2023-10-09 ENCOUNTER — Encounter: Payer: Medicare PPO | Admitting: Nurse Practitioner

## 2023-10-16 ENCOUNTER — Encounter: Payer: Self-pay | Admitting: Nurse Practitioner

## 2023-10-16 ENCOUNTER — Ambulatory Visit: Admitting: Nurse Practitioner

## 2023-10-16 VITALS — BP 112/66 | HR 75 | Temp 98.4°F | Ht 67.0 in | Wt 158.2 lb

## 2023-10-16 DIAGNOSIS — Z0000 Encounter for general adult medical examination without abnormal findings: Secondary | ICD-10-CM | POA: Diagnosis not present

## 2023-10-16 NOTE — Progress Notes (Signed)
 Subjective:   Randy Montgomery is a 78 y.o. male who presents for Medicare Annual/Subsequent preventive examination.  Visit Complete: In person   Cardiac Risk Factors include: advanced age (>57men, >103 women);hypertension;male gender     Objective:    Today's Vitals   10/16/23 1021  BP: 112/66  Pulse: 75  Temp: 98.4 F (36.9 C)  SpO2: 99%  Weight: 158 lb 3.2 oz (71.8 kg)  Height: 5' 7 (1.702 m)  PainSc: 0-No pain   Body mass index is 24.78 kg/m.     10/16/2023   10:24 AM 07/03/2023    9:39 AM 01/02/2023   10:25 AM 10/03/2022   10:00 AM 09/19/2022   10:38 AM 09/14/2022   10:06 AM 07/25/2022    8:34 AM  Advanced Directives  Does Patient Have a Medical Advance Directive? Yes Yes Yes Yes Yes Yes Yes  Type of Estate agent of [place];Living will;Out of facility DNR (pink MOST or yellow form) Healthcare Power of [place];Living will;Out of facility DNR (pink MOST or yellow form) Healthcare Power of [place];Out of facility DNR (pink MOST or yellow form);Living will Healthcare Power of [place];Out of facility DNR (pink MOST or yellow form);Living will Healthcare Power of [place];Out of facility DNR (pink MOST or yellow form);Living will Healthcare Power of [place];Out of facility DNR (pink MOST or yellow form);Living will Healthcare Power of [place];Out of facility DNR (pink MOST or yellow form);Living will  Does patient want to make changes to medical advance directive? No - Patient declined No - Patient declined No - Patient declined No - Patient declined No - Patient declined No - Patient declined No - Patient declined  Copy of Healthcare Power of Attorney in Chart? Yes - validated most recent copy scanned in chart (See row information) Yes - validated most recent copy scanned in chart (See row information) Yes - validated most recent copy scanned in chart (See row information) Yes - validated most recent copy scanned in chart (See row information) Yes -  validated most recent copy scanned in chart (See row information) Yes - validated most recent copy scanned in chart (See row information) Yes - validated most recent copy scanned in chart (See row information)  Pre-existing out of facility DNR order (yellow form or pink MOST form) Yellow form placed in chart (order not valid for inpatient use)          Current Medications (verified) Outpatient Encounter Medications as of 10/16/2023  Medication Sig   fluticasone (FLONASE) 50 MCG/ACT nasal spray Place into both nostrils.   furosemide  (LASIX ) 40 MG tablet TAKE 1 TABLET BY MOUTH ONCE DAILY   metoprolol succinate (TOPROL-XL) 25 MG 24 hr tablet TAKE ONE TABLET BY MOUTH ONCE DAILY   Multiple Vitamins-Minerals (PRESERVISION AREDS 2) CAPS Take 1 capsule by mouth 2 (two) times daily.   polyethylene glycol (MIRALAX / GLYCOLAX) 17 g packet Take 17 g by mouth as needed.   pravastatin (PRAVACHOL) 20 MG tablet TAKE ONE TABLET BY MOUTH ONCE DAILY   rivaroxaban (XARELTO) 20 MG TABS tablet TAKE ONE TABLET BY MOUTH ONCE DAILY WITHEVENING MEAL   spironolactone  (ALDACTONE ) 100 MG tablet Take 1 tablet (100 mg total) by mouth daily.   tamsulosin  (FLOMAX ) 0.4 MG CAPS capsule TAKE 2 CAPSULES AT BEDTIME   tizanidine  (ZANAFLEX ) 2 MG capsule Take 1 capsule (2 mg total) by mouth 3 (three) times daily as needed for muscle spasms.   No facility-administered encounter medications on file as of 10/16/2023.    Allergies (  verified) Patient has no known allergies.   History: Past Medical History:  Diagnosis Date   Atrial fibrillation [facility])    Per [facility] new patient packet   BPH (benign prostatic hyperplasia)    Dysrhythmia    Elevated alkaline phosphatase level    Elevated PSA    Enlarged prostate    High cholesterol    Hx of colonoscopy 2016   Per [facility] new patient packet   Hypertension    Sleep apnea    Per [facility] new patient packet   Slow urinary stream    Past Surgical History:  Procedure Laterality Date    COLONOSCOPY  07/03/2016   COLONOSCOPY WITH PROPOFOL  N/A 10/06/2020   Procedure: COLONOSCOPY WITH PROPOFOL ;  Surgeon: Therisa Bi, MD;  Location: [facility] ENDOSCOPY;  Service: Gastroenterology;  Laterality: N/A;   ESOPHAGOGASTRODUODENOSCOPY (EGD) WITH PROPOFOL  N/A 11/22/2021   Procedure: ESOPHAGOGASTRODUODENOSCOPY (EGD) WITH PROPOFOL ;  Surgeon: Therisa Bi, MD;  Location: [facility] ENDOSCOPY;  Service: Gastroenterology;  Laterality: N/A;   HERNIA REPAIR Right 2010   inguinal hernia    PROSTATE BIOPSY     TONSILLECTOMY     Family History  Problem Relation Age of Onset   Aneurysm Father    Heart attack Father    Heart disease Father    Social History   Socioeconomic History   Marital status: Married    Spouse name: Not on file   Number of children: Not on file   Years of education: Not on file   Highest education level: Bachelor's degree (e.g., BA, AB, BS)  Occupational History   Not on file  Tobacco Use   Smoking status: Never   Smokeless tobacco: Never  Vaping Use   Vaping status: Never Used  Substance and Sexual Activity   Alcohol use: Yes    Alcohol/week: 6.0 standard drinks of alcohol    Types: 6 Standard drinks or equivalent per week    Comment: [place], on the weekends   Drug use: Never   Sexual activity: Not on file  Other Topics Concern   Not on file  Social History Narrative   Diet      Do you drink/eat things with caffeine Yes      Marital Status Married  What year were you married? 1968      Do you live in a house, apartment, assisted living, condo, trailer, etc.? Assisted living      Is it one or more stories? 1      How many persons live in your home? 2         Do you have any pets in your home?(please list): No      Highest level of education completed: BA, [employer] 1967      Current or past profession: Financial planner      Do you exercise?: Moderately  Type and how often: [place], use wellnes equipment 2-3 times a week      Living Will?      DNR form? Yes    If  not, do you wish to discuss one?       POA/HPOA forms? Yes      Difficulty bathing or dressing yourself? Patient did not answer these questions on new patient packet      Difficulty preparing food or eating?      Difficulty managing medications?      Difficulty managing your finances?      Difficulty affording your medications?  Social Drivers of Corporate investment banker Strain: Low Risk  (06/29/2023)   Overall Financial Resource Strain (CARDIA)    Difficulty of Paying Living Expenses: Not hard at all  Food Insecurity: No Food Insecurity (06/29/2023)   Hunger Vital Sign    Worried About Running Out of Food in the Last Year: Never true    Ran Out of Food in the Last Year: Never true  Transportation Needs: No Transportation Needs (06/29/2023)   PRAPARE - Administrator, Civil Service (Medical): No    Lack of Transportation (Non-Medical): No  Physical Activity: Insufficiently Active (06/29/2023)   Exercise Vital Sign    Days of Exercise per Week: 3 days    Minutes of Exercise per Session: 20 min  Stress: No Stress Concern Present (06/29/2023)   [employer] of Occupational Health - Occupational Stress Questionnaire    Feeling of Stress: Not at all  Social Connections: Moderately Isolated (06/29/2023)   Social Connection and Isolation Panel    Frequency of Communication with Friends and Family: Once a week    Frequency of Social Gatherings with Friends and Family: Once a week    Attends Religious Services: Never    Database administrator or Organizations: Yes    Attends Engineer, structural: More than 4 times per year    Marital Status: Married    Tobacco Counseling Counseling given: Not Answered   Clinical Intake:  Pre-visit preparation completed: Yes  Pain : No/denies pain Pain Score: 0-No pain     BMI - recorded: 24.78 Nutritional Status: BMI of 19-24  Normal Nutritional Risks: None  How often do you need to  have someone help you when you read instructions, pamphlets, or other written materials from your doctor or pharmacy?: 1 - Never         Activities of Daily Living    10/16/2023   10:47 AM 10/12/2023    9:23 AM  In your present state of health, do you have any difficulty performing the following activities:  Hearing? 0 0  Vision? 0 0  Difficulty concentrating or making decisions? 0 0  Walking or climbing stairs? 0 0  Dressing or bathing? 0 0  Doing errands, shopping? 0 0  Preparing Food and eating ? N N  Using the Toilet? N N  In the past six months, have you accidently leaked urine? N N  Do you have problems with loss of bowel control? N N  Managing your Medications? N N  Managing your Finances? N N  Housekeeping or managing your Housekeeping? N N    Patient Care Team: Caro Harlene POUR, NP as PCP - General (Geriatric Medicine) Perla Evalene PARAS, MD as PCP - Cardiology (Cardiology) Therisa Bi, MD as Consulting Physician (Gastroenterology)  Indicate any recent Medical Services you may have received from other than [facility] providers in the past year (date may be approximate).     Assessment:   This is a routine wellness examination for [place].  Hearing/Vision screen Hearing Screening - Comments:: Hearing aids: working Vision Screening - Comments:: Last eye exam less than 12 months, with [facility]    Goals Addressed   None    Depression Screen    10/16/2023   10:22 AM 07/03/2023    9:39 AM 10/03/2022    9:58 AM 07/25/2022    8:34 AM 06/20/2022   10:13 AM 12/15/2021   10:14 AM 09/15/2021   11:53 AM  PHQ 2/9 Scores  PHQ -  2 Score 0 0 0 0 0 0 0    Fall Risk    10/16/2023   10:22 AM 10/12/2023    9:23 AM 07/03/2023    9:38 AM 03/15/2023   10:16 AM 10/03/2022    9:58 AM  Fall Risk   Falls in the past year? 0 0 0 0 0  Number falls in past yr: 0  0 0 0  Injury with Fall? 0  0 0 0  Risk for fall due to : No Fall Risks  No Fall Risks No Fall Risks   Follow  up Falls evaluation completed  Falls evaluation completed Falls evaluation completed     MEDICARE RISK AT HOME: Medicare Risk at Home Any stairs in or around the home?: No If so, are there any without handrails?: No Home free of loose throw rugs in walkways, pet beds, electrical cords, etc?: Yes Adequate lighting in your home to reduce risk of falls?: Yes Life alert?: No Use of a cane, walker or w/c?: No Grab bars in the bathroom?: Yes Shower chair or bench in shower?: No Elevated toilet seat or a handicapped toilet?: Yes  TIMED UP AND GO:  Was the test performed?  No    Cognitive Function:    10/03/2022   10:02 AM  MMSE - Mini Mental State Exam  Not completed: --  Orientation to time 5  Orientation to Place 5  Registration 3  Attention/ Calculation 5  Recall 3  Language- name 2 objects 2  Language- repeat 1  Language- follow 3 step command 3  Language- read & follow direction 1  Write a sentence 1  Copy design 1  Total score 30        10/16/2023   10:25 AM 09/15/2021    1:35 PM 09/09/2020    9:55 AM  6CIT Screen  What Year? 0 points 0 points 0 points  What month? 0 points 0 points 0 points  What time? 0 points 0 points 0 points  Count back from 20 0 points 0 points 0 points  Months in reverse 2 points 0 points 2 points  Repeat phrase 0 points 2 points 0 points  Total Score 2 points 2 points 2 points    Immunizations Immunization History  Administered Date(s) Administered   Fluad Quad(high Dose 65+) 09/26/2021   Hep A / Hep B 01/16/2022, 06/29/2022   Hepatitis A 12/16/2021   Hepatitis B 12/16/2021   INFLUENZA, HIGH DOSE SEASONAL PF 10/01/2014, 09/22/2015, 10/14/2020, 10/26/2022   Influenza Split 10/24/2011   Influenza, Seasonal, Injecte, Preservative Fre 12/06/1999, 09/26/2012   Influenza,inj,Quad PF,6+ Mos 09/23/2013, 10/01/2014, 10/04/2016, 10/23/2017, 10/24/2017   Influenza-Unspecified 09/23/2013, 10/21/2019, 09/26/2021   Moderna Covid-19 Fall Seasonal  Vaccine 65yrs & older 04/11/2022   Moderna Covid-19 Vaccine Bivalent Booster 83yrs & up 05/31/2021   Moderna Sars-Covid-2 Vaccination 01/16/2019, 02/13/2019, 11/18/2019, 05/20/2020   Pfizer Covid-19 Vaccine Bivalent Booster 52yrs & up 09/23/2020, 11/11/2021, 11/22/2022   Pneumococcal Conjugate-13 11/07/2012   Pneumococcal Polysaccharide-23 06/16/2008, 06/25/2009   Td 04/15/1992, 04/05/2002   Tdap 12/16/2010, 08/25/2022   Tetanus 04/05/2002   Unspecified SARS-COV-2 Vaccination 04/11/2022   Zoster Recombinant(Shingrix) 09/17/2017, 12/19/2017   Zoster, Live 06/10/2007    TDAP status: Up to date  Flu Vaccine status: Due, Education has been provided regarding the importance of this vaccine. Advised may receive this vaccine at local pharmacy or Health Dept. Aware to provide a copy of the vaccination record if obtained from local pharmacy or Health Dept. Verbalized acceptance  and understanding.  Pneumococcal vaccine status: Up to date  Covid-19 vaccine status: Information provided on how to obtain vaccines.   Qualifies for Shingles Vaccine? Yes   Zostavax completed No   Shingrix Completed?: Yes  Screening Tests Health Maintenance  Topic Date Due   Influenza Vaccine  08/03/2023   COVID-19 Vaccine (10 - 2025-26 season) 09/03/2023   Medicare Annual Wellness (AWV)  10/15/2024   DTaP/Tdap/Td (6 - Td or Tdap) 08/24/2032   Pneumococcal Vaccine: 50+ Years  Completed   Zoster Vaccines- Shingrix  Completed   Meningococcal B Vaccine  Aged Out   Hepatitis B Vaccines 19-59 Average Risk  Discontinued   Hepatitis C Screening  Discontinued    Health Maintenance  Health Maintenance Due  Topic Date Due   Influenza Vaccine  08/03/2023   COVID-19 Vaccine (10 - 2025-26 season) 09/03/2023    Colorectal cancer screening: No longer required.   Lung Cancer Screening: (Low Dose CT Chest recommended if Age 15-80 years, 20 pack-year currently smoking OR have quit w/in 15years.) does not qualify.    Lung Cancer Screening Referral: na  Additional Screening:  Hepatitis C Screening: does not qualify; Completed   Vision Screening: Recommended annual ophthalmology exams for early detection of glaucoma and other disorders of the eye. Is the patient up to date with their annual eye exam?  Yes  Who is the provider or what is the name of the office in which the patient attends annual eye exams? [facility]  If pt is not established with a provider, would they like to be referred to a provider to establish care? No .   Dental Screening: Recommended annual dental exams for proper oral hygiene   Community Resource Referral / Chronic Care Management: CRR required this visit?  No   CCM required this visit?  No     Plan:     I have personally reviewed and noted the following in the patient's chart:   Medical and social history Use of alcohol, tobacco or illicit drugs  Current medications and supplements including opioid prescriptions. Patient is not currently taking opioid prescriptions. Functional ability and status Nutritional status Physical activity Advanced directives List of other physicians Hospitalizations, surgeries, and ER visits in previous 12 months Vitals Screenings to include cognitive, depression, and falls Referrals and appointments  In addition, I have reviewed and discussed with patient certain preventive protocols, quality metrics, and best practice recommendations. A written personalized care plan for preventive services as well as general preventive health recommendations were provided to patient.     Harlene MARLA An, NP   10/16/2023

## 2023-10-16 NOTE — Patient Instructions (Addendum)
  Mr. Randy Montgomery , Thank you for taking time to come for your Medicare Wellness Visit. I appreciate your ongoing commitment to your health goals. Please review the following plan we discussed and let me know if I can assist you in the future.   COVID and flu shot at [facility].   This is a list of the screening recommended for you and due dates:  Health Maintenance  Topic Date Due   Flu Shot  08/03/2023   COVID-19 Vaccine (10 - 2025-26 season) 09/03/2023   Medicare Annual Wellness Visit  10/15/2024   DTaP/Tdap/Td vaccine (6 - Td or Tdap) 08/24/2032   Pneumococcal Vaccine for age over 72  Completed   Zoster (Shingles) Vaccine  Completed   Meningitis B Vaccine  Aged Out   Hepatitis B Vaccine  Discontinued   Hepatitis C Screening  Discontinued

## 2023-10-25 ENCOUNTER — Encounter: Payer: Self-pay | Admitting: Nurse Practitioner

## 2023-10-26 NOTE — Telephone Encounter (Signed)
 Okay to get a [facility] C&S and send it off,  can you call them to set this up. thanks

## 2023-10-30 NOTE — Telephone Encounter (Signed)
 Orders placed and patient notified.  Appointment scheduled.

## 2023-11-07 NOTE — Progress Notes (Unsigned)
 11/08/2023 Randy Montgomery Slack [id number] 01-May-1945  Gastroenterology Office Note    Referring Provider: Caro Harlene POUR, NP Primary Care Physician:  Caro Harlene POUR, NP  Primary GI Provider: Jinny Carmine, MD    Chief Complaint   Chief Complaint  Patient presents with   Follow-up    Cirrhosis-no issues today     History of Present Illness   Randy Montgomery is a 78 y.o. male with PMHX of cirrhosis, Afib on Xarelto presenting today for 31-month follow-up for cirrhosis of the liver.   Patient states he feels well.  He reports that he used to drink heavily but now only consumes alcohol sporadically, depending on the occasion.  States that if there is a social event he may have a 1 weak drink.  States he may have consumed maybe 3 alcoholic drinks in the last month.    Patient last seen by Dr. Therisa on 05/15/2023.  Hep A total Ab positive, Hep B surface AB reactive Alk phosphatase 132  05/07/2023 US  ABD RUQ IMPRESSION: 1. Cirrhotic liver morphology. No suspicious focal lesion identified. 2. Trace volume ascites. 3. Cardiomegaly with prominence of the IVC which may reflect RIGHT heart dysfunction.  12/10/2022: MR abdomen MRCP : for elevated alk phos :  1. Cirrhosis. No suspicious liver lesion. 2. Moderate volume ascites. 3. Dilatation of the inferior vena cava, consistent with elevated right heart pressures. 4. Cardiomegaly. Moderate right pleural effusion. 5. Descending colonic diverticulosis without evidence of acute diverticulitis.  11/06/2022 RUQ ultrasound IMPRESSION: 1. Cirrhosis of the liver. 2. Ascites.  09/15/2022 CT scan of the abdomen pelvis without contrast shows large right inguinal hernia which extends to the scrotum contains a large portion of cecum hepatic cirrhosis moderate size pleural effusion with adjacent atelectasis the right lower lobe.  MELD score of 11 in April 2024 05/05/2022: Right upper quadrant ultrasound no liver masses  seen.  11/22/2021 EGD - Mild Schatzki ring. - Small hiatal hernia.  - Normal examined duodenum.  - No specimens collected. - Repeat in 3 years for surveillance.   Past Medical History:  Diagnosis Date   Atrial fibrillation [facility])    Per [facility] new patient packet   BPH (benign prostatic hyperplasia)    Dysrhythmia    Elevated alkaline phosphatase level    Elevated PSA    Enlarged prostate    High cholesterol    Hx of colonoscopy 2016   Per [facility] new patient packet   Hypertension    Sleep apnea    Per [facility] new patient packet   Slow urinary stream     Past Surgical History:  Procedure Laterality Date   COLONOSCOPY  07/03/2016   COLONOSCOPY WITH PROPOFOL  N/A 10/06/2020   Procedure: COLONOSCOPY WITH PROPOFOL ;  Surgeon: Therisa Bi, MD;  Location[facility] ENDOSCOPY;  Service: Gastroenterology;  Laterality: N/A;   ESOPHAGOGASTRODUODENOSCOPY (EGD) WITH PROPOFOL  N/A 11/22/2021   Procedure: ESOPHAGOGASTRODUODENOSCOPY (EGD) WITH PROPOFOL ;  Surgeon: Therisa Bi, MD;  Location: [facility] ENDOSCOPY;  Service: Gastroenterology;  Laterality: N/A;   HERNIA REPAIR Right 2010   inguinal hernia    PROSTATE BIOPSY     TONSILLECTOMY      Current Outpatient Medications  Medication Sig Dispense Refill   fluticasone (FLONASE) 50 MCG/ACT nasal spray Place into both nostrils.     furosemide  (LASIX ) 40 MG tablet TAKE 1 TABLET BY MOUTH ONCE DAILY 30 tablet 2   metoprolol succinate (TOPROL-XL) 25 MG 24 hr tablet TAKE ONE TABLET BY MOUTH ONCE DAILY 90 tablet 1  Multiple Vitamins-Minerals (PRESERVISION AREDS 2) CAPS Take 1 capsule by mouth 2 (two) times daily.     polyethylene glycol (MIRALAX / GLYCOLAX) 17 g packet Take 17 g by mouth as needed.     pravastatin (PRAVACHOL) 20 MG tablet TAKE ONE TABLET BY MOUTH ONCE DAILY 90 tablet 1   rivaroxaban (XARELTO) 20 MG TABS tablet TAKE ONE TABLET BY MOUTH ONCE DAILY WITHEVENING MEAL 90 tablet 3   spironolactone  (ALDACTONE ) 100 MG tablet Take 1 tablet (100 mg total) by  mouth daily. 90 tablet 3   tamsulosin  (FLOMAX ) 0.4 MG CAPS capsule TAKE 2 CAPSULES AT BEDTIME 180 capsule 3   tizanidine  (ZANAFLEX ) 2 MG capsule Take 1 capsule (2 mg total) by mouth 3 (three) times daily as needed for muscle spasms. 20 capsule 1   No current facility-administered medications for this visit.    Allergies as of 11/08/2023   (No Known Allergies)    Family History  Problem Relation Age of Onset   Aneurysm Father    Heart attack Father    Heart disease Father     Social History   Socioeconomic History   Marital status: Married    Spouse name: Not on file   Number of children: Not on file   Years of education: Not on file   Highest education level: Bachelor's degree (e.g., BA, AB, BS)  Occupational History   Not on file  Tobacco Use   Smoking status: Never   Smokeless tobacco: Never  Vaping Use   Vaping status: Never Used  Substance and Sexual Activity   Alcohol use: Yes    Alcohol/week: 6.0 standard drinks of alcohol    Types: 6 Standard drinks or equivalent per week    Comment: [place], on the weekends   Drug use: Never   Sexual activity: Not on file  Other Topics Concern   Not on file  Social History Narrative   Diet      Do you drink/eat things with caffeine Yes      Marital Status Married  What year were you married? 1968      Do you live in a house, apartment, assisted living, condo, trailer, etc.? Assisted living      Is it one or more stories? 1      How many persons live in your home? 2         Do you have any pets in your home?(please list): No      Highest level of education completed: BA, [employer] 1967      Current or past profession: Financial Planner      Do you exercise?: Moderately  Type and how often: Wak, use wellnes equipment 2-3 times a week      Living Will?      DNR form? Yes    If not, do you wish to discuss one?       POA/HPOA forms? Yes      Difficulty bathing or dressing yourself? Patient did not answer these questions  on new patient packet      Difficulty preparing food or eating?      Difficulty managing medications?      Difficulty managing your finances?      Difficulty affording your medications?                     Social Drivers of Health   Financial Resource Strain: Low Risk  (06/29/2023)   Overall Financial Resource Strain (CARDIA)  Difficulty of Paying Living Expenses: Not hard at all  Food Insecurity: No Food Insecurity (06/29/2023)   Hunger Vital Sign    Worried About Running Out of Food in the Last Year: Never true    Ran Out of Food in the Last Year: Never true  Transportation Needs: No Transportation Needs (06/29/2023)   PRAPARE - Administrator, Civil Service (Medical): No    Lack of Transportation (Non-Medical): No  Physical Activity: Insufficiently Active (06/29/2023)   Exercise Vital Sign    Days of Exercise per Week: 3 days    Minutes of Exercise per Session: 20 min  Stress: No Stress Concern Present (06/29/2023)   [employer] of Occupational Health - Occupational Stress Questionnaire    Feeling of Stress: Not at all  Social Connections: Moderately Isolated (06/29/2023)   Social Connection and Isolation Panel    Frequency of Communication with Friends and Family: Once a week    Frequency of Social Gatherings with Friends and Family: Once a week    Attends Religious Services: Never    Database Administrator or Organizations: Yes    Attends Engineer, Structural: More than 4 times per year    Marital Status: Married  Catering Manager Violence: Not on file     RELEVANT GI HISTORY, IMAGING AND LABS: CBC    Component Value Date/Time   WBC 6.8 05/15/2023 1409   RBC 3.23 (L) 05/15/2023 1409   RBC 3.85 (A) 12/19/2021 0000   HGB 11.5 (L) 05/15/2023 1409   HCT 32.9 (L) 05/15/2023 1409   PLT 181 05/15/2023 1409   MCV 102 (H) 05/15/2023 1409   MCH 35.6 (H) 05/15/2023 1409   MCHC 35.0 05/15/2023 1409   RDW 12.3 05/15/2023 1409   LYMPHSABS 1.4  05/15/2023 1409   EOSABS 0.2 05/15/2023 1409   BASOSABS 0.1 05/15/2023 1409   Recent Labs    01/09/23 1011 02/13/23 1429 05/15/23 1409  HGB 11.8* 11.7* 11.5*    CMP     Component Value Date/Time   NA 138 05/15/2023 1409   K 4.7 05/15/2023 1409   CL 99 05/15/2023 1409   CO2 25 05/15/2023 1409   GLUCOSE 84 05/15/2023 1409   GLUCOSE 80 09/18/2022 0800   BUN 29 (H) 05/15/2023 1409   CREATININE 1.20 05/15/2023 1409   CREATININE 0.75 09/18/2022 0800   CALCIUM 9.7 05/15/2023 1409   PROT 6.6 05/15/2023 1409   ALBUMIN 4.2 05/15/2023 1409   AST 21 05/15/2023 1409   ALT 19 05/15/2023 1409   ALKPHOS 132 (H) 05/15/2023 1409   BILITOT 2.0 (H) 05/15/2023 1409   GFRNONAA 71 06/17/2020 0000   GFRAA 83 06/17/2020 0000      Latest Ref Rng & Units 05/15/2023    2:09 PM 02/13/2023    2:29 PM 09/26/2022    2:30 PM  Hepatic Function  Total Protein 6.0 - 8.5 g/dL 6.6  7.1  6.7   Albumin 3.8 - 4.8 g/dL 4.2  4.4  4.2   AST 0 - 40 IU/L 21  22  26    ALT 0 - 44 IU/L 19  16  18    Alk Phosphatase 44 - 121 IU/L 132  161  197   Total Bilirubin 0.0 - 1.2 mg/dL 2.0  2.5  4.2       Review of Systems   All systems reviewed and negative except where noted in HPI.    Physical Exam  BP (!) 105/48   Pulse ROLLEN)  50   Temp 98.4 F (36.9 C)   Ht 5' 7 (1.702 m)   Wt 159 lb 12.8 oz (72.5 kg)   SpO2 99%   BMI 25.03 kg/m  No LMP for male patient. General:   Alert and oriented. Pleasant and cooperative. Well-nourished and well-developed.  Head:  Normocephalic and atraumatic. Eyes:  Without icterus Ears:  Normal auditory acuity. Lungs:  Respirations even and unlabored.  Clear throughout to auscultation.   No wheezes, crackles, or rhonchi. No acute distress. Heart:  irregularly irregular, no murmurs, clicks, rubs, or gallops. Abdomen:  Normal bowel sounds.  No bruits.  Soft, non-tender and non-distended without masses, hepatosplenomegaly or hernias noted.  No guarding or rebound tenderness.      Rectal:  Deferred. Msk:  Symmetrical without gross deformities. Normal posture. Extremities:  Without edema. Neurologic:  Alert and  oriented x4;  grossly normal neurologically. Skin:  Intact without significant lesions or rashes. Psych:  Alert and cooperative. Normal mood and affect.   Assessment & Plan   Randy Montgomery is a 78 y.o. male presenting today for 6 month follow up for cirrhosis.    Cirrhosis of liver. Discussed reason for routine labs and imaging every 6 months for increased incidence of HCC and risk of esophageal varices.  - Advised to stop all alcohol -Continue Lasix  40 mg once daily and spironolactone  100 mg once daily. - check AFP - will order RUQ Ultrasound  - Discussed follow-up screening for esophageal varices will be due Nov 2026.   Follow-up in 6 months for repeat AFP and RUQ ultrasound.    Grayce Bohr, DNP, AGNP-C [facility]

## 2023-11-08 ENCOUNTER — Ambulatory Visit: Admitting: Family Medicine

## 2023-11-08 ENCOUNTER — Encounter: Payer: Self-pay | Admitting: Family Medicine

## 2023-11-08 VITALS — BP 105/48 | HR 50 | Temp 98.4°F | Ht 67.0 in | Wt 159.8 lb

## 2023-11-08 DIAGNOSIS — K7030 Alcoholic cirrhosis of liver without ascites: Secondary | ICD-10-CM | POA: Diagnosis not present

## 2023-11-08 NOTE — Patient Instructions (Addendum)
 Ultrasound scheduled @ [facility] 11/12/23 arrive at 8:45 am. Nothing to eat/drink after midnight.

## 2023-11-09 LAB — AFP TUMOR MARKER: AFP, Serum, Tumor Marker: 2.5 ng/mL (ref 0.0–8.4)

## 2023-11-11 ENCOUNTER — Ambulatory Visit: Payer: Self-pay | Admitting: Family Medicine

## 2023-11-12 ENCOUNTER — Ambulatory Visit
Admission: RE | Admit: 2023-11-12 | Discharge: 2023-11-12 | Disposition: A | Discharge status: Home / Self Care | Source: Ambulatory Visit | Attending: Family Medicine | Admitting: Family Medicine

## 2023-11-12 DIAGNOSIS — K7030 Alcoholic cirrhosis of liver without ascites: Secondary | ICD-10-CM | POA: Diagnosis not present

## 2023-11-12 DIAGNOSIS — K7689 Other specified diseases of liver: Secondary | ICD-10-CM | POA: Diagnosis not present

## 2023-11-12 DIAGNOSIS — K7460 Unspecified cirrhosis of liver: Secondary | ICD-10-CM | POA: Diagnosis not present

## 2023-11-12 NOTE — Telephone Encounter (Signed)
 LVM to call office for lab results

## 2023-11-12 NOTE — Telephone Encounter (Signed)
 Spoke with patient- also let him know when we get the Ultrasound results I will call him.  AFP tumor maker normal.    Dorothe, please call and let him know that his alpha-fetoprotein lab is normal. We monitor this every 6 months along with liver imaging to screen for liver cancer in patient's with cirrhosis. His marker is not elevated which is reassuring.    Thanks,   Grayce Bohr, DNP, AGNP-BC

## 2023-11-14 ENCOUNTER — Encounter: Payer: Self-pay | Admitting: Family Medicine

## 2023-11-16 ENCOUNTER — Telehealth: Payer: Self-pay | Admitting: Family Medicine

## 2023-11-16 NOTE — Telephone Encounter (Signed)
 I have called and spoke with patient myself to let him know that his ultrasound did not show signs of liver cancer.  We will monitor AFP and ultrasound every 6 months.  Patient's questions were answered and no additional concerns at this time.

## 2023-11-19 ENCOUNTER — Ambulatory Visit: Admitting: Dermatology

## 2023-11-21 ENCOUNTER — Ambulatory Visit: Admitting: Dermatology

## 2023-11-21 DIAGNOSIS — Z1283 Encounter for screening for malignant neoplasm of skin: Secondary | ICD-10-CM

## 2023-11-21 DIAGNOSIS — L814 Other melanin hyperpigmentation: Secondary | ICD-10-CM

## 2023-11-21 DIAGNOSIS — L820 Inflamed seborrheic keratosis: Secondary | ICD-10-CM | POA: Diagnosis not present

## 2023-11-21 DIAGNOSIS — L570 Actinic keratosis: Secondary | ICD-10-CM | POA: Diagnosis not present

## 2023-11-21 DIAGNOSIS — D229 Melanocytic nevi, unspecified: Secondary | ICD-10-CM

## 2023-11-21 DIAGNOSIS — L578 Other skin changes due to chronic exposure to nonionizing radiation: Secondary | ICD-10-CM | POA: Diagnosis not present

## 2023-11-21 DIAGNOSIS — L817 Pigmented purpuric dermatosis: Secondary | ICD-10-CM | POA: Diagnosis not present

## 2023-11-21 DIAGNOSIS — L821 Other seborrheic keratosis: Secondary | ICD-10-CM | POA: Diagnosis not present

## 2023-11-21 DIAGNOSIS — W908XXA Exposure to other nonionizing radiation, initial encounter: Secondary | ICD-10-CM

## 2023-11-21 DIAGNOSIS — D1801 Hemangioma of skin and subcutaneous tissue: Secondary | ICD-10-CM | POA: Diagnosis not present

## 2023-11-21 NOTE — Progress Notes (Signed)
 Follow-Up Visit   Subjective  Randy Montgomery is a 78 y.o. male who presents for the following: Skin Cancer Screening and Full Body Skin Exam Hx of aks and isks   Spots on right scalp  The patient presents for Total-Body Skin Exam (TBSE) for skin cancer screening and mole check. The patient has spots, moles and lesions to be evaluated, some may be new or changing and the patient may have concern these could be cancer.    The following portions of the chart were reviewed this encounter and updated as appropriate: medications, allergies, medical history  Review of Systems:  No other skin or systemic complaints except as noted in HPI or Assessment and Plan.  Objective  Well appearing patient in no apparent distress; mood and affect are within normal limits.  A full examination was performed including scalp, head, eyes, ears, nose, lips, neck, chest, axillae, abdomen, back, buttocks, bilateral upper extremities, bilateral lower extremities, hands, feet, fingers, toes, fingernails, and toenails. All findings within normal limits unless otherwise noted below.   Relevant physical exam findings are noted in the Assessment and Plan.  right anterior scalp x 3, left zygoma x 1 (4) Erythematous stuck-on, waxy papule or plaque scalp x 6 (6) Erythematous thin papules/macules with gritty scale.   Assessment & Plan   SKIN CANCER SCREENING PERFORMED TODAY.  ACTINIC DAMAGE - Chronic condition, secondary to cumulative UV/sun exposure - diffuse scaly erythematous macules with underlying dyspigmentation - Recommend daily broad spectrum sunscreen SPF 30+ to sun-exposed areas, reapply every 2 hours as needed.  - Staying in the shade or wearing long sleeves, sun glasses (UVA+UVB protection) and wide brim hats (4-inch brim around the entire circumference of the hat) are also recommended for sun protection.  - Call for new or changing lesions.  LENTIGINES, SEBORRHEIC KERATOSES, HEMANGIOMAS - Benign  normal skin lesions - Benign-appearing - Call for any changes  MELANOCYTIC NEVI - Tan-brown and/or pink-flesh-colored symmetric macules and papules - Benign appearing on exam today - Observation - Call clinic for new or changing moles - Recommend daily use of broad spectrum spf 30+ sunscreen to sun-exposed areas.    SCHAMBERG'S PIGMENTED PURPURA Exam: cayenne-pepper-like macules with associated golden-brown pigmentation of lower legs.  +/- lower leg edema  Schamberg's purpura is a benign chronic condition that may also have intermittent episodes of worsening/improvement.  It is the most common type of pigmented purpura and is typically asymptomatic.  It generally affects older individuals, and may be related to leg swelling, increased activity, or excessive alcohol use.  Sometimes short term treatment with a mid-potency topical steroid is given for acute flares.  Treatment Plan: Benign, observe.   Recommend daily graduated compression hose/stockings- easiest to put on first thing in morning, remove at bedtime.     INFLAMED SEBORRHEIC KERATOSIS (4) right anterior scalp x 3, left zygoma x 1 (4) Symptomatic, irritating, patient would like treated. Destruction of lesion - right anterior scalp x 3, left zygoma x 1 (4) Complexity: simple   Destruction method: cryotherapy   Informed consent: discussed and consent obtained   Timeout:  patient name, date of birth, surgical site, and procedure verified Lesion destroyed using liquid nitrogen: Yes   Region frozen until ice ball extended beyond lesion: Yes   Outcome: patient tolerated procedure well with no complications   Post-procedure details: wound care instructions given    ACTINIC KERATOSIS (6) scalp x 6 (6) Actinic keratoses are precancerous spots that appear secondary to cumulative UV radiation exposure/sun  exposure over time. They are chronic with expected duration over 1 year. A portion of actinic keratoses will progress to  squamous cell carcinoma of the skin. It is not possible to reliably predict which spots will progress to skin cancer and so treatment is recommended to prevent development of skin cancer.  Recommend daily broad spectrum sunscreen SPF 30+ to sun-exposed areas, reapply every 2 hours as needed.  Recommend staying in the shade or wearing long sleeves, sun glasses (UVA+UVB protection) and wide brim hats (4-inch brim around the entire circumference of the hat). Call for new or changing lesions. Destruction of lesion - scalp x 6 (6) Complexity: simple   Destruction method: cryotherapy   Informed consent: discussed and consent obtained   Timeout:  patient name, date of birth, surgical site, and procedure verified Lesion destroyed using liquid nitrogen: Yes   Region frozen until ice ball extended beyond lesion: Yes   Outcome: patient tolerated procedure well with no complications   Post-procedure details: wound care instructions given    Return in about 1 year (around 11/20/2024) for TBSE.  IEleanor Blush, CMA, am acting as scribe for Alm Rhyme, MD.   Documentation: I have reviewed the above documentation for accuracy and completeness, and I agree with the above.  Alm Rhyme, MD

## 2023-11-21 NOTE — Patient Instructions (Addendum)
 Seborrheic Keratosis  What causes seborrheic keratoses? Seborrheic keratoses are harmless, common skin growths that first appear during adult life.  As time goes by, more growths appear.  Some people may develop a large number of them.  Seborrheic keratoses appear on both covered and uncovered body parts.  They are not caused by sunlight.  The tendency to develop seborrheic keratoses can be inherited.  They vary in color from skin-colored to gray, brown, or even black.  They can be either smooth or have a rough, warty surface.   Seborrheic keratoses are superficial and look as if they were stuck on the skin.  Under the microscope this type of keratosis looks like layers upon layers of skin.  That is why at times the top layer may seem to fall off, but the rest of the growth remains and re-grows.    Treatment Seborrheic keratoses do not need to be treated, but can easily be removed in the office.  Seborrheic keratoses often cause symptoms when they rub on clothing or jewelry.  Lesions can be in the way of shaving.  If they become inflamed, they can cause itching, soreness, or burning.  Removal of a seborrheic keratosis can be accomplished by freezing, burning, or surgery. If any spot bleeds, scabs, or grows rapidly, please return to have it checked, as these can be an indication of a skin cancer.   Cryotherapy Aftercare  Wash gently with soap and water everyday.   Apply Vaseline and Band-Aid daily until healed.    Actinic keratoses are precancerous spots that appear secondary to cumulative UV radiation exposure/sun exposure over time. They are chronic with expected duration over 1 year. A portion of actinic keratoses will progress to squamous cell carcinoma of the skin. It is not possible to reliably predict which spots will progress to skin cancer and so treatment is recommended to prevent development of skin cancer.  Recommend daily broad spectrum sunscreen SPF 30+ to sun-exposed areas, reapply  every 2 hours as needed.  Recommend staying in the shade or wearing long sleeves, sun glasses (UVA+UVB protection) and wide brim hats (4-inch brim around the entire circumference of the hat). Call for new or changing lesions.     Melanoma ABCDEs  Melanoma is the most dangerous type of skin cancer, and is the leading cause of death from skin disease.  You are more likely to develop melanoma if you: Have light-colored skin, light-colored eyes, or red or blond hair Spend a lot of time in the sun Tan regularly, either outdoors or in a tanning bed Have had blistering sunburns, especially during childhood Have a close family member who has had a melanoma Have atypical moles or large birthmarks  Early detection of melanoma is key since treatment is typically straightforward and cure rates are extremely high if we catch it early.   The first sign of melanoma is often a change in a mole or a new dark spot.  The ABCDE system is a way of remembering the signs of melanoma.  A for asymmetry:  The two halves do not match. B for border:  The edges of the growth are irregular. C for color:  A mixture of colors are present instead of an even brown color. D for diameter:  Melanomas are usually (but not always) greater than 6mm - the size of a pencil eraser. E for evolution:  The spot keeps changing in size, shape, and color.  Please check your skin once per month between visits. You can  use a small mirror in front and a large mirror behind you to keep an eye on the back side or your body.   If you see any new or changing lesions before your next follow-up, please call to schedule a visit.  Please continue daily skin protection including broad spectrum sunscreen SPF 30+ to sun-exposed areas, reapplying every 2 hours as needed when you're outdoors.   Staying in the shade or wearing long sleeves, sun glasses (UVA+UVB protection) and wide brim hats (4-inch brim around the entire circumference of the hat)  are also recommended for sun protection.    Due to recent changes in healthcare laws, you may see results of your pathology and/or laboratory studies on MyChart before the doctors have had a chance to review them. We understand that in some cases there may be results that are confusing or concerning to you. Please understand that not all results are received at the same time and often the doctors may need to interpret multiple results in order to provide you with the best plan of care or course of treatment. Therefore, we ask that you please give us  2 business days to thoroughly review all your results before contacting the office for clarification. Should we see a critical lab result, you will be contacted sooner.   If You Need Anything After Your Visit  If you have any questions or concerns for your doctor, please call our main line at [phone] and press option 4 to reach your doctor's medical assistant. If no one answers, please leave a voicemail as directed and we will return your call as soon as possible. Messages left after 4 pm will be answered the following business day.   You may also send us  a message via MyChart. We typically respond to MyChart messages within 1-2 business days.  For prescription refills, please ask your pharmacy to contact our office. Our fax number is [phone].  If you have an urgent issue when the clinic is closed that cannot wait until the next business day, you can page your doctor at the number below.    Please note that while we do our best to be available for urgent issues outside of office hours, we are not available 24/7.   If you have an urgent issue and are unable to reach us , you may choose to seek medical care at your doctor's office, retail clinic, urgent care center, or emergency room.  If you have a medical emergency, please immediately call 911 or go to the emergency department.  Pager Numbers  - Dr. Hester: [phone]  - Dr. Jackquline:  [phone]  - Dr. Claudene: [phone]   - Dr. Raymund: [phone]  In the event of inclement weather, please call our main line at [phone] for an update on the status of any delays or closures.  Dermatology Medication Tips: Please keep the boxes that topical medications come in in order to help keep track of the instructions about where and how to use these. Pharmacies typically print the medication instructions only on the boxes and not directly on the medication tubes.   If your medication is too expensive, please contact our office at [phone] option 4 or send us  a message through MyChart.   We are unable to tell what your co-pay for medications will be in advance as this is different depending on your insurance coverage. However, we may be able to find a substitute medication at lower cost or fill out paperwork to get insurance to cover  a needed medication.   If a prior authorization is required to get your medication covered by your insurance company, please allow us  1-2 business days to complete this process.  Drug prices often vary depending on where the prescription is filled and some pharmacies may offer cheaper prices.  The website www.goodrx.com contains coupons for medications through different pharmacies. The prices here do not account for what the cost may be with help from insurance (it may be cheaper with your insurance), but the website can give you the price if you did not use any insurance.  - You can print the associated coupon and take it with your prescription to the pharmacy.  - You may also stop by our office during regular business hours and pick up a GoodRx coupon card.  - If you need your prescription sent electronically to a different pharmacy, notify our office through [facility] or by phone at [phone] option 4.     Si Usted Necesita Algo Despus de Su Visita  Tambin puede enviarnos un mensaje a travs de Clinical cytogeneticist. Por lo general  respondemos a los mensajes de MyChart en el transcurso de 1 a 2 das hbiles.  Para renovar recetas, por favor pida a su farmacia que se ponga en contacto con nuestra oficina. Randi lakes de fax es [place] [phone].  Si tiene un asunto urgente cuando la clnica est cerrada y que no puede esperar hasta el siguiente da hbil, puede llamar/localizar a su doctor(a) al nmero que aparece a continuacin.   Por favor, tenga en cuenta que aunque hacemos todo lo posible para estar disponibles para asuntos urgentes fuera del horario de [place], no estamos disponibles las 24 horas del da, los 7 [address]  [address] de la [place].   Si tiene un problema urgente y no puede comunicarse con nosotros, puede optar por buscar atencin mdica  en el consultorio de su doctor(a), en una clnica privada, en un centro de atencin urgente o en una sala de emergencias.  Si tiene Engineer, drilling, por favor llame inmediatamente al 911 o vaya a la sala de emergencias.  Nmeros de bper  - Dr. Hester: [phone]  - Dra. Jackquline: [phone]  - Dr. Claudene: [phone]  - Dra. Kitts: [phone]  En caso de inclemencias del [place], por favor llame a nuestra lnea principal al [phone] para una actualizacin sobre el estado de cualquier retraso o cierre.  Consejos para la medicacin en dermatologa: Por favor, guarde las cajas en las que vienen los medicamentos de uso tpico para ayudarle a seguir las instrucciones sobre dnde y cmo usarlos. Las farmacias generalmente imprimen las instrucciones del medicamento slo en las cajas y no directamente en los tubos del [place].   Si su medicamento es muy caro, por favor, pngase en contacto con landry rieger llamando al [phone] y presione la opcin 4 o envenos un mensaje a travs de Clinical cytogeneticist.   No podemos decirle cul ser su copago por los medicamentos por adelantado ya que esto es diferente dependiendo de la cobertura de su seguro. Sin embargo, es posible  que podamos encontrar un medicamento sustituto a Audiological scientist un formulario para que el seguro cubra el medicamento que se considera necesario.   Si se requiere una autorizacin previa para que su compaa de seguros malta su medicamento, por favor permtanos de 1 a 2 das hbiles para completar este proceso.  Los precios de los medicamentos varan con frecuencia dependiendo del Environmental consultant de dnde se surte la receta y iraq  pueden ofrecer precios ms baratos.  El sitio web www.goodrx.com tiene cupones para medicamentos de Health and safety inspector. Los precios aqu no tienen en cuenta lo que podra costar con la ayuda del seguro (puede ser ms barato con su seguro), pero el sitio web puede darle el precio si no utiliz Tourist information centre manager.  - Puede imprimir el cupn correspondiente y llevarlo con su receta a la farmacia.  - Tambin puede pasar por nuestra oficina durante el horario de atencin regular y Education officer, museum una tarjeta de cupones de GoodRx.  - Si necesita que su receta se enve electrnicamente a una farmacia diferente, informe a nuestra oficina a travs de MyChart de [place] o por telfono llamando al [phone] y presione la opcin 4.

## 2023-11-22 ENCOUNTER — Ambulatory Visit: Payer: Medicare PPO | Admitting: Dermatology

## 2023-11-26 ENCOUNTER — Encounter: Payer: Self-pay | Admitting: Dermatology

## 2023-12-06 ENCOUNTER — Other Ambulatory Visit: Payer: Self-pay | Admitting: Nurse Practitioner

## 2023-12-06 MED ORDER — SPIRONOLACTONE 100 MG PO TABS: 100.0000 mg | ORAL_TABLET | Freq: Every day | ORAL | 3 refills | Status: AC

## 2023-12-06 NOTE — Telephone Encounter (Signed)
 Copied from CRM #[id number]. Topic: Clinical - Medication Refill >> Dec 06, 2023 11:52 AM Debby BROCKS wrote: Medication: spironolactone  (ALDACTONE ) 100 MG tablet  Has the patient contacted their pharmacy? Yes (Agent: If no, request that the patient contact the pharmacy for the refill. If patient does not wish to contact the pharmacy document the reason why and proceed with request.) (Agent: If yes, when and what did the pharmacy advise?)  This is the patient's preferred pharmacy:  [facility] - [place], [place] - [address] [place] [address] Phone: [phone] Fax: [phone]  Is this the correct pharmacy for this prescription? Yes If no, delete pharmacy and type the correct one.   Has the prescription been filled recently? No  Is the patient out of the medication? Yes  Has the patient been seen for an appointment in the last year OR does the patient have an upcoming appointment? Yes  Can we respond through MyChart? Yes  Agent: Please be advised that Rx refills may take up to 3 business days. We ask that you follow-up with your pharmacy.

## 2023-12-10 ENCOUNTER — Other Ambulatory Visit: Payer: Self-pay | Admitting: Nurse Practitioner

## 2024-01-08 ENCOUNTER — Encounter: Payer: Self-pay | Admitting: Nurse Practitioner

## 2024-01-08 ENCOUNTER — Ambulatory Visit: Payer: Self-pay | Admitting: Nurse Practitioner

## 2024-01-08 VITALS — BP 128/62 | HR 74 | Temp 98.1°F | Ht 67.0 in | Wt 160.0 lb

## 2024-01-08 DIAGNOSIS — G4733 Obstructive sleep apnea (adult) (pediatric): Secondary | ICD-10-CM

## 2024-01-08 DIAGNOSIS — R350 Frequency of micturition: Secondary | ICD-10-CM | POA: Diagnosis not present

## 2024-01-08 DIAGNOSIS — I429 Cardiomyopathy, unspecified: Secondary | ICD-10-CM

## 2024-01-08 DIAGNOSIS — E782 Mixed hyperlipidemia: Secondary | ICD-10-CM

## 2024-01-08 DIAGNOSIS — N401 Enlarged prostate with lower urinary tract symptoms: Secondary | ICD-10-CM

## 2024-01-08 DIAGNOSIS — D649 Anemia, unspecified: Secondary | ICD-10-CM | POA: Diagnosis not present

## 2024-01-08 DIAGNOSIS — K7469 Other cirrhosis of liver: Secondary | ICD-10-CM | POA: Diagnosis not present

## 2024-01-08 DIAGNOSIS — I482 Chronic atrial fibrillation, unspecified: Secondary | ICD-10-CM | POA: Diagnosis not present

## 2024-01-08 DIAGNOSIS — R351 Nocturia: Secondary | ICD-10-CM

## 2024-01-08 NOTE — Progress Notes (Unsigned)
 "   Careteam: Patient Care Team: Caro Harlene POUR, NP as PCP - General (Geriatric Medicine) Perla Evalene PARAS, MD as PCP - Cardiology (Cardiology) Therisa Bi, MD as Consulting Physician (Gastroenterology) PLACE OF SERVICE:  [facility]   Advanced Directive information Does Patient Have a Medical Advance Directive?: Yes, Type of Advance Directive: Healthcare Power of [place];Living will;Out of facility DNR (pink MOST or yellow form), Does patient want to make changes to medical advance directive?: No - Patient declined  Allergies[1]  Chief Complaint  Patient presents with   Medical Management of Chronic Issues    Medical Management of Chronic Issues. 6 Month follow up     HPI: Patient is a 79 y.o. male seen in today at [facility].  Discussed the use of AI scribe software for clinical note transcription with the patient, who gave verbal consent to proceed.  History of Present Illness Randy Montgomery is an 79 year old male with cirrhosis and atrial fibrillation who presents for a six-month follow-up.  He experienced a fall on December 19th while hurrying across a street, resulting in a possible bruised rib. He has mild pain when taking a deep breath, but notes improvement. No significant injuries such as wrist pain or fractures were sustained.  He underwent an ultrasound of the liver and recalls being told he has cirrhosis and benign hepatic cysts. He confirms abstaining from alcohol, except for a single light drink over the holidays.  He is on Xarelto once daily for atrial fibrillation and metoprolol for rate control. He experiences occasional palpitations but no significant fast heartbeats. He has not seen a cardiologist recently.  He uses a CPAP machine for sleep apnea, which he has had since 2012. He received a new machine in 2022 but has not had settings reviewed recently. He associates the need for CPAP with his atrial fibrillation.  He has a history of  congestive heart failure and is on spironolactone  100 mg and metoprolol 25 mg. His last echocardiogram in 2023 showed an ejection fraction of 40-45%.  He takes tamsulosin  0.2 mg, two tablets at bedtime, for prostate issues and reports no significant urinary symptoms, though he does get up once at night to urinate.  He is on Pravachol for cholesterol management and is due for a cholesterol check.   He uses Miralax occasionally for bowel regularity and reports no constipation.   Review of Systems:  Review of Systems  Constitutional:  Negative for chills, fever and weight loss.  HENT:  Negative for tinnitus.   Respiratory:  Negative for cough, sputum production and shortness of breath.   Cardiovascular:  Negative for chest pain, palpitations and leg swelling.  Gastrointestinal:  Negative for abdominal pain, constipation, diarrhea and heartburn.  Genitourinary:  Negative for dysuria, frequency and urgency.  Musculoskeletal:  Negative for back pain, falls, joint pain and myalgias.  Skin: Negative.   Neurological:  Negative for dizziness and headaches.  Psychiatric/Behavioral:  Negative for depression and memory loss. The patient does not have insomnia.     Past Medical History:  Diagnosis Date   Atrial fibrillation [facility])    Per [facility] new patient packet   BPH (benign prostatic hyperplasia)    Dysrhythmia    Elevated alkaline phosphatase level    Elevated PSA    Enlarged prostate    High cholesterol    Hx of colonoscopy 2016   Per [facility] new patient packet   Hypertension    Sleep apnea    Per [facility]  new patient packet   Slow urinary stream    Past Surgical History:  Procedure Laterality Date   COLONOSCOPY  07/03/2016   COLONOSCOPY WITH PROPOFOL  N/A 10/06/2020   Procedure: COLONOSCOPY WITH PROPOFOL ;  Surgeon: Therisa Bi, MD;  Location[facility] ENDOSCOPY;  Service: Gastroenterology;  Laterality: N/A;   ESOPHAGOGASTRODUODENOSCOPY (EGD) WITH PROPOFOL  N/A 11/22/2021   Procedure:  ESOPHAGOGASTRODUODENOSCOPY (EGD) WITH PROPOFOL ;  Surgeon: Therisa Bi, MD;  Location[facility] ENDOSCOPY;  Service: Gastroenterology;  Laterality: N/A;   HERNIA REPAIR Right 2010   inguinal hernia    PROSTATE BIOPSY     TONSILLECTOMY     Social History:   reports that he has never smoked. He has never used smokeless tobacco. He reports current alcohol use of about 6.0 standard drinks of alcohol per week. He reports that he does not use drugs.  Family History  Problem Relation Age of Onset   Aneurysm Father    Heart attack Father    Heart disease Father     Medications: Patient's Medications  New Prescriptions   No medications on file  Previous Medications   FLUTICASONE (FLONASE) 50 MCG/ACT NASAL SPRAY    Place into both nostrils.   FUROSEMIDE  (LASIX ) 40 MG TABLET    TAKE 1 TABLET BY MOUTH ONCE DAILY   METOPROLOL SUCCINATE (TOPROL-XL) 25 MG 24 HR TABLET    TAKE ONE TABLET BY MOUTH ONCE DAILY   MULTIPLE VITAMINS-MINERALS (PRESERVISION AREDS 2) CAPS    Take 1 capsule by mouth 2 (two) times daily.   POLYETHYLENE GLYCOL (MIRALAX / GLYCOLAX) 17 G PACKET    Take 17 g by mouth as needed.   PRAVASTATIN (PRAVACHOL) 20 MG TABLET    TAKE ONE TABLET BY MOUTH ONCE DAILY   RIVAROXABAN (XARELTO) 20 MG TABS TABLET    TAKE ONE TABLET BY MOUTH ONCE DAILY WITHEVENING MEAL   SPIRONOLACTONE  (ALDACTONE ) 100 MG TABLET    Take 1 tablet (100 mg total) by mouth daily.   TAMSULOSIN  (FLOMAX ) 0.4 MG CAPS CAPSULE    TAKE 2 CAPSULES AT BEDTIME   TIZANIDINE  (ZANAFLEX ) 2 MG CAPSULE    Take 1 capsule (2 mg total) by mouth 3 (three) times daily as needed for muscle spasms.  Modified Medications   No medications on file  Discontinued Medications   No medications on file    Physical Exam:  Vitals:   01/08/24 0927  BP: 128/62  Pulse: 74  Temp: 98.1 F (36.7 C)  SpO2: 99%  Weight: 160 lb (72.6 kg)  Height: 5' 7 (1.702 m)   Body mass index is 25.06 kg/m. Wt Readings from Last 3 Encounters:  01/08/24 160 lb  (72.6 kg)  11/08/23 159 lb 12.8 oz (72.5 kg)  10/16/23 158 lb 3.2 oz (71.8 kg)    Physical Exam Constitutional:      General: He is not in acute distress.    Appearance: He is well-developed. He is not diaphoretic.  HENT:     Head: Normocephalic and atraumatic.     Right Ear: External ear normal.     Left Ear: External ear normal.     Mouth/Throat:     Pharynx: No oropharyngeal exudate.  Eyes:     Conjunctiva/sclera: Conjunctivae normal.     Pupils: Pupils are equal, round, and reactive to light.  Cardiovascular:     Rate and Rhythm: Normal rate and regular rhythm.     Heart sounds: Normal heart sounds.  Pulmonary:     Effort: Pulmonary effort is normal.  Breath sounds: Normal breath sounds.  Abdominal:     General: Bowel sounds are normal.     Palpations: Abdomen is soft.  Musculoskeletal:        General: No tenderness.     Cervical back: Normal range of motion and neck supple.     Right lower leg: No edema.     Left lower leg: No edema.  Skin:    General: Skin is warm and dry.  Neurological:     Mental Status: He is alert and oriented to person, place, and time.     Labs reviewed: Basic Metabolic Panel: Recent Labs    01/09/23 1010 02/13/23 1429 05/15/23 1409  NA 140 140 138  K 4.5 4.6 4.7  CL 104 102 99  CO2 22 25 25   GLUCOSE 87 74 84  BUN 36* 35* 29*  CREATININE 1.01 1.09 1.20  CALCIUM 9.4 9.7 9.7   Liver Function Tests: Recent Labs    02/13/23 1429 05/15/23 1409  AST 22 21  ALT 16 19  ALKPHOS 161* 132*  BILITOT 2.5* 2.0*  PROT 7.1 6.6  ALBUMIN 4.4 4.2   No results for input(s): LIPASE, AMYLASE in the last 8760 hours. No results for input(s): AMMONIA in the last 8760 hours. CBC: Recent Labs    01/09/23 1011 02/13/23 1429 05/15/23 1409  WBC 5.1 5.5 6.8  NEUTROABS 3.4 3.6 4.5  HGB 11.8* 11.7* 11.5*  HCT 33.8* 33.9* 32.9*  MCV 100* 100* 102*  PLT 151 144* 181   Lipid Panel: No results for input(s): CHOL, HDL, LDLCALC,  TRIG, CHOLHDL, LDLDIRECT in the last 8760 hours. TSH: No results for input(s): TSH in the last 8760 hours. A1C: No results found for: HGBA1C   Assessment/Plan Assessment and Plan Assessment & Plan Cirrhosis of liver Ongoing, cirrhosis recent ultrasound. Hepatic cysts benign. Minimal alcohol consumption reported. - Ordered follow-up labs to monitor liver function. - Continue abstinence from alcohol.  Chronic atrial fibrillation Managed with Xarelto and metoprolol. No significant palpitations or fast heartbeats reported. - Continue Xarelto for anticoagulation. - Continue metoprolol for rate control. - Provided cardiology contact information for follow-up appointment.  Heart failure with reduced ejection fraction Euvolemic, ejection fraction 40-45% on last echocardiogram. Managed with spironolactone  and metoprolol. - Continue spironolactone  and metoprolol.  Anemia No recent bleeding episodes. Occasional epistaxis likely related to Xarelto. - Ordered follow-up labs to monitor blood counts.  Obstructive sleep apnea Managed with CPAP since 2012. CPAP settings not recently reviewed. - Referred to pulmonology for CPAP management and potential setting adjustments.  Benign prostatic hyperplasia with lower urinary tract symptoms Managed with tamsulosin . No significant urinary symptoms, nocturia persists. - Continue tamsulosin .  Mixed hyperlipidemia Managed with Pravachol. Due for cholesterol re-evaluation. - Ordered cholesterol re-evaluation.  General health maintenance Routine health maintenance discussed. - Scheduled follow-up appointment in six months. - Scheduled lab work for Thursday morning at 7:30 AM.    Adalid Beckmann K. Caro BODILY  [facility] [phone]      [1] No Known Allergies  "

## 2024-01-08 NOTE — Patient Instructions (Addendum)
 To call and make follow up appt with cardiologist  [facility] at [facility] [phone]  To come to [facility] on Thursday 01/10/2024 for labs at 7:30 am

## 2024-01-10 ENCOUNTER — Ambulatory Visit: Attending: Cardiology | Admitting: Cardiology

## 2024-01-10 ENCOUNTER — Encounter: Payer: Self-pay | Admitting: Cardiology

## 2024-01-10 VITALS — BP 100/60 | HR 49 | Ht 67.0 in | Wt 162.0 lb

## 2024-01-10 DIAGNOSIS — I10 Essential (primary) hypertension: Secondary | ICD-10-CM | POA: Diagnosis not present

## 2024-01-10 DIAGNOSIS — I429 Cardiomyopathy, unspecified: Secondary | ICD-10-CM | POA: Diagnosis not present

## 2024-01-10 DIAGNOSIS — E782 Mixed hyperlipidemia: Secondary | ICD-10-CM

## 2024-01-10 DIAGNOSIS — G4733 Obstructive sleep apnea (adult) (pediatric): Secondary | ICD-10-CM

## 2024-01-10 DIAGNOSIS — I4821 Permanent atrial fibrillation: Secondary | ICD-10-CM | POA: Diagnosis not present

## 2024-01-10 DIAGNOSIS — R600 Localized edema: Secondary | ICD-10-CM | POA: Diagnosis not present

## 2024-01-10 NOTE — Progress Notes (Signed)
 " Cardiology Office Note   Date:  01/10/2024  ID:  Sheryl, Towell 01-Sep-1945, MRN [id number] PCP: Caro Harlene POUR, NP  [facility] Providers Cardiologist:  Evalene Lunger, MD Cardiology APP:  Gerard Frederick, NP     History of Present Illness Randy Montgomery is a 79 y.o. 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   Had previously been diagnosed with atrial fibrillation dating back in 2011.  Had failed cardioversion in 2012.  Echocardiogram in 09/2021 revealed ejection fraction 40 to 45%, RV function mildly reduced, left and right atrium was severely dilated, moderate pleural effusion.  He had to be started on Lasix  20 mg daily for leg swelling once improved over a 82-month timeframe.  He continued be compliant with CPAP.  In 10/2021 he continued to have bilateral leg swelling was continued on 20 mg of Lasix .  He was noted to be bradycardic but was asymptomatic.   He was last seen in clinic 02/09/2023 stating overall he had been doing well from a cardiac perspective.  Denied any symptoms of decompensation.  Had been compliant with his current medication regimen.  There were no changes that were made to his medication and no further testing that was ordered at that time.   He returns to clinic today stating that he has been doing fairly well from a cardiac perspective.  States that they had just spent the [place] at the beach as months is more about experiences now.  He denies any shortness of breath, chest pain or palpitations.  Continues to have some occasional provide Lippold edema that he wears compression stockings for.  States he has been compliant with his rivaroxaban.  Denies any bleeding with no blood noted in his urine or stool.  He denies any hospitalizations or visits to the emergency  department.  ROS: 10 point review of systems has been reviewed and considered negative exception was been listed in the HPI  Studies Reviewed EKG Interpretation Date/Time:  Thursday January 10 2024 14:21:46 EST Ventricular Rate:  49 PR Interval:    QRS Duration:  146 QT Interval:  508 QTC Calculation: 458 R Axis:   -16  Text Interpretation: atrial fibrillation that is rate controlled with PVC's versus Aberrantly conducted beats Right bundle branch block When compared with ECG of 09-Aug-2022 09:57, T wave inversion no longer evident in Lateral leads Confirmed by Gerard Frederick ([id number]) on 01/10/2024 3:13:17 PM    TTE 09/02/21 1. Left ventricular ejection fraction, by estimation, is 40 to 45%. The  left ventricle has mildly decreased function. The left ventricle  demonstrates global hypokinesis. Left ventricular diastolic parameters are  indeterminate.   2. Right ventricular systolic function is moderately reduced. The right  ventricular size is severely enlarged. There is normal pulmonary artery  systolic pressure. The estimated right ventricular systolic pressure is  24.9 mmHg.   3. Left atrial size was severely dilated.   4. Right atrial size was severely dilated.   5. Moderate pleural effusion in the left lateral region.   6. The mitral valve is normal in structure. No evidence of mitral valve  regurgitation. No evidence of mitral stenosis.   7. Tricuspid valve regurgitation is moderate to severe.   8. The aortic valve is normal in structure. Aortic valve regurgitation is  not visualized. Aortic valve sclerosis/calcification is present, without  any  evidence of aortic stenosis.   9. There is mild dilatation of the aortic root, measuring 41 mm.  10. The inferior vena cava is normal in size with greater than 50%  respiratory variability, suggesting right atrial pressure of 3 mmHg.   Risk Assessment/Calculations  CHA2DS2-VASc Score = 3   This indicates a 3.2% annual risk of  stroke. The patient's score is based upon: CHF History: 0 HTN History: 1 Diabetes History: 0 Stroke History: 0 Vascular Disease History: 0 Age Score: 2 Gender Score: 0            Physical Exam VS:  BP 100/60 (BP Location: Left Arm, Patient Position: Sitting, Cuff Size: Normal)   Pulse (!) 49   Ht 5' 7 (1.702 m)   Wt 162 lb (73.5 kg)   SpO2 97%   BMI 25.37 kg/m        Wt Readings from Last 3 Encounters:  01/10/24 162 lb (73.5 kg)  01/08/24 160 lb (72.6 kg)  11/08/23 159 lb 12.8 oz (72.5 kg)    GEN: Well nourished, well developed in no acute distress NECK: No JVD; No carotid bruits CARDIAC: IR IR, no murmurs, rubs, gallops RESPIRATORY:  Clear to auscultation without rales, wheezing or rhonchi  ABDOMEN: Soft, non-tender, non-distended EXTREMITIES: Trace pretibial edema; No deformity   ASSESSMENT AND PLAN Permanent atrial fibrillation that is rate controlled at a rate of 49 today on EKG with right bundle branch block and PVCs versus aberrantly conducted beats.  He has continued on Toprol-XL 25 mg daily to suppress his aberrantly conducted versus PVC beats and occasional palpitations.  He is chronotropically appropriate and asymptomatic to his heart rate.  He has also continued on rivaroxaban 20 mg daily for CHA2DS2-VASc of at least 3 for stroke prophylaxis.  Primary hypertension with a blood pressure today 100/60.  Blood pressures remain stable.  He is continued on Toprol 25 mg daily and Aldactone  100 mg daily.  He has been encouraged to continue to monitor his pressures 1 to 2 hours postmedication administration at home as well.  Mixed hyperlipidemia with an LDL of 59.  He is continued on pravastatin 20 mg daily with ongoing management per his PCP.  Cardiomyopathy with an LVEF of 40 to 45% on echocardiogram dated 09/2021.  Patient's symptoms are unchanged.  He has no signs of decompensation.  He is continued on furosemide  40 mg daily Aldactone  100 mg daily and Toprol-XL 25 mg  daily.  Bilateral lower extremity edema where he continues to wear knee-high compression stockings.  He has been encouraged to participate in conservative therapy of elevating his extremities, decrease in sodium intake and that calf pump exercises and his compression stockings.  Obstructive sleep apnea where he continues to be compliant with CPAP.       Dispo: Patient to return to clinic with MD/APP in 6 months or sooner if needed for further evaluation.  Signed, Clayden Withem, NP   "

## 2024-01-10 NOTE — Patient Instructions (Signed)
 Medication Instructions:  Your physician recommends that you continue on your current medications as directed. Please refer to the Current Medication list given to you today.   *If you need a refill on your cardiac medications before your next appointment, please call your pharmacy*  Lab Work: No labs ordered today  If you have labs (blood work) drawn today and your tests are completely normal, you will receive your results only by: MyChart Message (if you have MyChart) OR A paper copy in the mail If you have any lab test that is abnormal or we need to change your treatment, we will call you to review the results.  Testing/Procedures: No test ordered today   Follow-Up: At [facility], you and your health needs are our priority.  As part of our continuing mission to provide you with exceptional heart care, our providers are all part of one team.  This team includes your primary Cardiologist (physician) and Advanced Practice Providers or APPs (Physician Assistants and Nurse Practitioners) who all work together to provide you with the care you need, when you need it.  Your next appointment:   6 month(s)  Provider:   You may see Timothy Gollan, MD or one of the following Advanced Practice Providers on your designated Care Team:   Tylene Lunch, NP

## 2024-01-11 ENCOUNTER — Ambulatory Visit: Payer: Self-pay | Admitting: Nurse Practitioner

## 2024-01-11 LAB — CBC WITH DIFFERENTIAL/PLATELET
Absolute Lymphocytes: 1774 {cells}/uL (ref 850–3900)
Absolute Monocytes: 723 {cells}/uL (ref 200–950)
Basophils Absolute: 51 {cells}/uL (ref 0–200)
Basophils Relative: 0.7 %
Eosinophils Absolute: 212 {cells}/uL (ref 15–500)
Eosinophils Relative: 2.9 %
HCT: 38.3 % — ABNORMAL LOW (ref 39.4–51.1)
Hemoglobin: 12.7 g/dL — ABNORMAL LOW (ref 13.2–17.1)
MCH: 34 pg — ABNORMAL HIGH (ref 27.0–33.0)
MCHC: 33.2 g/dL (ref 31.6–35.4)
MCV: 102.7 fL — ABNORMAL HIGH (ref 81.4–101.7)
MPV: 10.5 fL (ref 7.5–12.5)
Monocytes Relative: 9.9 %
Neutro Abs: 4541 {cells}/uL (ref 1500–7800)
Neutrophils Relative %: 62.2 %
Platelets: 224 Thousand/uL (ref 140–400)
RBC: 3.73 Million/uL — ABNORMAL LOW (ref 4.20–5.80)
RDW: 11.5 % (ref 11.0–15.0)
Total Lymphocyte: 24.3 %
WBC: 7.3 Thousand/uL (ref 3.8–10.8)

## 2024-01-11 LAB — COMPREHENSIVE METABOLIC PANEL WITH GFR
AG Ratio: 2.1 (calc) (ref 1.0–2.5)
ALT: 16 U/L (ref 9–46)
AST: 19 U/L (ref 10–35)
Albumin: 4.6 g/dL (ref 3.6–5.1)
Alkaline phosphatase (APISO): 140 U/L (ref 35–144)
BUN/Creatinine Ratio: 38 (calc) — ABNORMAL HIGH (ref 6–22)
BUN: 43 mg/dL — ABNORMAL HIGH (ref 7–25)
CO2: 28 mmol/L (ref 20–32)
Calcium: 9.3 mg/dL (ref 8.6–10.3)
Chloride: 100 mmol/L (ref 98–110)
Creat: 1.14 mg/dL (ref 0.70–1.22)
Globulin: 2.2 g/dL (ref 1.9–3.7)
Glucose, Bld: 96 mg/dL (ref 65–99)
Potassium: 4.3 mmol/L (ref 3.5–5.3)
Sodium: 137 mmol/L (ref 135–146)
Total Bilirubin: 1.6 mg/dL — ABNORMAL HIGH (ref 0.2–1.2)
Total Protein: 6.8 g/dL (ref 6.1–8.1)
eGFR: 65 mL/min/1.73m2

## 2024-01-11 LAB — TEST AUTHORIZATION

## 2024-01-11 LAB — LIPID PANEL
Cholesterol: 188 mg/dL
HDL: 79 mg/dL
LDL Cholesterol (Calc): 94 mg/dL
Non-HDL Cholesterol (Calc): 109 mg/dL
Total CHOL/HDL Ratio: 2.4 (calc)
Triglycerides: 66 mg/dL

## 2024-01-11 LAB — VITAMIN B12: Vitamin B-12: 654 pg/mL (ref 200–1100)

## 2024-01-11 LAB — FOLATE: Folate: 8.5 ng/mL

## 2024-01-14 ENCOUNTER — Other Ambulatory Visit: Payer: Self-pay | Admitting: Nurse Practitioner

## 2024-01-22 ENCOUNTER — Ambulatory Visit: Admitting: Sleep Medicine

## 2024-01-22 ENCOUNTER — Encounter: Payer: Self-pay | Admitting: Sleep Medicine

## 2024-01-22 VITALS — BP 110/64 | HR 99 | Temp 98.1°F | Ht 67.0 in | Wt 161.2 lb

## 2024-01-22 DIAGNOSIS — I4891 Unspecified atrial fibrillation: Secondary | ICD-10-CM | POA: Diagnosis not present

## 2024-01-22 DIAGNOSIS — I482 Chronic atrial fibrillation, unspecified: Secondary | ICD-10-CM

## 2024-01-22 DIAGNOSIS — G4733 Obstructive sleep apnea (adult) (pediatric): Secondary | ICD-10-CM | POA: Diagnosis not present

## 2024-01-22 NOTE — Patient Instructions (Addendum)

## 2024-01-22 NOTE — Progress Notes (Signed)
 "      Name:Randy Montgomery MRN: [id number] DOB: 1945/12/25   CHIEF COMPLAINT:  ESTABLISH CARE FOR OSA   HISTORY OF PRESENT ILLNESS: Randy Montgomery is a 79 y.o. w/ a h/o OSA, atrial fibrillation and hyperlipidemia who presents to establish care for OSA. Reports that he was initially diagnosed with OSA several years ago and was subsequently started on CPAP therapy. He is currently using the Airtouch F20 FFM, which is comfortable. Denies air leaks or nasal congestion. Reports feeling refreshed upon awakening with CPAP therapy. Reports nocturnal awakenings due to nocturia, however does not have difficulty falling back to sleep. Reports significant weight changes. Denies morning headaches, RLS symptoms, dream enactment, cataplexy, hypnagogic or hypnapompic hallucinations.Denies a family history of sleep apnea. Denies drowsy driving. Drinks 2 cups of coffee daily, occasional alcohol use, denies tobacco or illicit drug use.   Bedtime 11-11:30 pm Sleep onset 10 mins Rise time 8-8:30 am   EPWORTH SLEEP SCORE 7    01/22/2024    1:52 PM  Results of the Epworth flowsheet  Sitting and reading 1  Watching TV 1  Sitting, inactive in a public place (e.g. a theatre or a meeting) 1  As a passenger in a car for an hour without a break 1  Lying down to rest in the afternoon when circumstances permit 2  Sitting and talking to someone 0  Sitting quietly after a lunch without alcohol 1  In a car, while stopped for a few minutes in traffic 0  Total score 7    PAST MEDICAL HISTORY :   has a past medical history of Atrial fibrillation (HCC), BPH (benign prostatic hyperplasia), Dysrhythmia, Elevated alkaline phosphatase level, Elevated PSA, Enlarged prostate, High cholesterol, colonoscopy (2016), Hypertension, Sleep apnea, and Slow urinary stream.  has a past surgical history that includes Hernia repair (Right, 2010); Tonsillectomy; Prostate biopsy; Colonoscopy (07/03/2016); Colonoscopy with propofol  (N/A,  10/06/2020); and Esophagogastroduodenoscopy (egd) with propofol  (N/A, 11/22/2021). Prior to Admission medications  Medication Sig Start Date End Date Taking? Authorizing Provider  fluticasone (FLONASE) 50 MCG/ACT nasal spray Place into both nostrils. 04/04/23   [provider]  furosemide  (LASIX ) 40 MG tablet TAKE 1 TABLET BY MOUTH ONCE DAILY 06/20/23   Eubanks, Jessica K, NP  metoprolol succinate (TOPROL-XL) 25 MG 24 hr tablet TAKE ONE TABLET BY MOUTH ONCE DAILY 09/04/23   Eubanks, Jessica K, NP  Multiple Vitamins-Minerals (PRESERVISION AREDS 2) CAPS Take 1 capsule by mouth 2 (two) times daily.    [provider]  polyethylene glycol (MIRALAX / GLYCOLAX) 17 g packet Take 17 g by mouth as needed.    [provider]  pravastatin (PRAVACHOL) 20 MG tablet TAKE ONE TABLET BY MOUTH ONCE DAILY 12/10/23   Eubanks, Jessica K, NP  rivaroxaban (XARELTO) 20 MG TABS tablet TAKE ONE TABLET BY MOUTH ONCE DAILY WITHEVENING MEAL 09/17/23   Eubanks, Jessica K, NP  spironolactone  (ALDACTONE ) 100 MG tablet Take 1 tablet (100 mg total) by mouth daily. 12/06/23   Caro Harlene POUR, NP  tamsulosin  (FLOMAX ) 0.4 MG CAPS capsule TAKE 2 CAPSULES BY MOUTH AT BEDTIME 01/14/24   Eubanks, Jessica K, NP  tizanidine  (ZANAFLEX ) 2 MG capsule Take 1 capsule (2 mg total) by mouth 3 (three) times daily as needed for muscle spasms. 03/23/21   Caro Harlene POUR, NP   Allergies[1]  FAMILY HISTORY:  family history includes Aneurysm in his father; Heart attack in his father; Heart disease in his father. SOCIAL HISTORY:  reports that he has  never smoked. He has never used smokeless tobacco. He reports current alcohol use of about 6.0 standard drinks of alcohol per week. He reports that he does not use drugs.   Review of Systems:  Gen:  Denies  fever, sweats, chills weight loss  HEENT: Denies blurred vision, double vision, ear pain, eye pain, hearing loss, nose bleeds, sore throat Cardiac:  No dizziness, chest pain  or heaviness, chest tightness,edema, No JVD Resp:   No cough, -sputum production, -shortness of breath,-wheezing, -hemoptysis,  Gi: Denies swallowing difficulty, stomach pain, nausea or vomiting, diarrhea, constipation, bowel incontinence Gu:  Denies bladder incontinence, burning urine Ext:   Denies Joint pain, stiffness or swelling Skin: Denies  skin rash, easy bruising or bleeding or hives Endoc:  Denies polyuria, polydipsia , polyphagia or weight change Psych:   Denies depression, insomnia or hallucinations  Other:  All other systems negative  VITAL SIGNS: Pulse 99   Temp 98.1 F (36.7 C)   Ht 5' 7 (1.702 m)   Wt 161 lb 3.2 oz (73.1 kg)   SpO2 100%   BMI 25.25 kg/m    Physical Examination:   General Appearance: No distress  EYES PERRLA, EOM intact.   NECK Supple, No JVD Pulmonary: normal breath sounds, No wheezing.  CardiovascularNormal S1,S2.  No m/r/g.   Abdomen: Benign, Soft, non-tender. Skin:   warm, no rashes, no ecchymosis  Extremities: normal, no cyanosis, clubbing. Neuro:without focal findings,  speech normal  PSYCHIATRIC: Mood, affect within normal limits.   ASSESSMENT AND PLAN  OSA Patient is using and benefiting from CPAP therapy. Due to elevated AHI, will change pressure to auto setting 4-14 cm H2O. Discussed the consequences of untreated sleep apnea. Advised not to drive drowsy for safety of patient and others. Will follow up in 3 months.    Atrial fibrillation Stable, on current management. Following with cardiology.    Patient  satisfied with Plan of action and management. All questions answered  I spent a total of 34 minutes reviewing chart data, face-to-face evaluation with the patient, counseling and coordination of care as detailed above.    Alexzandrea Normington, M.D.  Sleep Medicine [facility]           [1] No Known Allergies  "

## 2024-04-22 ENCOUNTER — Ambulatory Visit: Admitting: Sleep Medicine

## 2024-07-08 ENCOUNTER — Ambulatory Visit: Admitting: Nurse Practitioner

## 2024-10-16 ENCOUNTER — Ambulatory Visit: Payer: Self-pay | Admitting: Nurse Practitioner

## 2024-12-03 ENCOUNTER — Ambulatory Visit: Admitting: Dermatology
